# Patient Record
Sex: Male | Born: 1990 | Race: Black or African American | Hispanic: No | Marital: Married | Smoking: Never smoker
Health system: Southern US, Community
[De-identification: ages and names within clinical notes are randomized; demographics above are authoritative.]

## PROBLEM LIST (undated history)

## (undated) HISTORY — PX: OTHER SURGICAL HISTORY: SHX169

## (undated) HISTORY — PX: CHOLECYSTECTOMY: SHX55

---
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
## 2013-06-06 ENCOUNTER — Emergency Department (HOSPITAL_COMMUNITY)
Admission: EM | Admit: 2013-06-06 | Discharge: 2013-06-06 | Disposition: A | Discharge status: Home / Self Care | Payer: Self-pay | Attending: Emergency Medicine | Admitting: Emergency Medicine

## 2013-06-06 ENCOUNTER — Encounter (HOSPITAL_COMMUNITY): Payer: Self-pay | Admitting: Emergency Medicine

## 2013-06-06 DIAGNOSIS — H109 Unspecified conjunctivitis: Secondary | ICD-10-CM | POA: Insufficient documentation

## 2013-06-06 DIAGNOSIS — N342 Other urethritis: Secondary | ICD-10-CM | POA: Insufficient documentation

## 2013-06-06 DIAGNOSIS — R300 Dysuria: Secondary | ICD-10-CM | POA: Insufficient documentation

## 2013-06-06 LAB — URINALYSIS, ROUTINE W REFLEX MICROSCOPIC
Bilirubin Urine: NEGATIVE
Glucose, UA: NEGATIVE mg/dL
Ketones, ur: NEGATIVE mg/dL
Nitrite: NEGATIVE
Protein, ur: NEGATIVE mg/dL
Specific Gravity, Urine: 1.014 (ref 1.005–1.030)
Urobilinogen, UA: 0.2 mg/dL (ref 0.0–1.0)
pH: 5.5 (ref 5.0–8.0)

## 2013-06-06 LAB — URINE MICROSCOPIC-ADD ON

## 2013-06-06 LAB — GC/CHLAMYDIA PROBE AMP
CT Probe RNA: NEGATIVE
GC Probe RNA: NEGATIVE

## 2013-06-06 LAB — RPR: RPR Ser Ql: NONREACTIVE

## 2013-06-06 LAB — HIV ANTIBODY (ROUTINE TESTING W REFLEX): HIV: NONREACTIVE

## 2013-06-06 MED ORDER — DOXYCYCLINE HYCLATE 100 MG PO CAPS: 100.0000 mg | ORAL_CAPSULE | Freq: Two times a day (BID) | ORAL | Status: DC

## 2013-06-06 MED ORDER — LIDOCAINE HCL (PF) 1 % IJ SOLN
INTRAMUSCULAR | Status: AC
  Administered 2013-06-06: 2.1 mL
  Filled 2013-06-06: qty 5

## 2013-06-06 MED ORDER — SULFACETAMIDE SODIUM 10 % OP SOLN: 2.0000 [drp] | OPHTHALMIC | Status: DC

## 2013-06-06 MED ORDER — DOXYCYCLINE HYCLATE 100 MG PO TABS
100.0000 mg | ORAL_TABLET | Freq: Once | ORAL | Status: AC
  Administered 2013-06-06: 100 mg via ORAL
  Filled 2013-06-06: qty 1

## 2013-06-06 MED ORDER — CEFTRIAXONE SODIUM 250 MG IJ SOLR
250.0000 mg | Freq: Once | INTRAMUSCULAR | Status: AC
Start: 2013-06-06 — End: 2013-06-06
  Administered 2013-06-06: 250 mg via INTRAMUSCULAR
  Filled 2013-06-06: qty 250

## 2013-06-06 MED ORDER — AZITHROMYCIN 1 G PO PACK
1.0000 g | PACK | Freq: Once | ORAL | Status: AC
  Administered 2013-06-06: 1 g via ORAL
  Filled 2013-06-06: qty 1

## 2013-06-06 NOTE — ED Notes (Signed)
PT. REPORTS RIGHT EYE IRRITATION WITH REDDNESS  , PAIN / ITCH AND DRAINAGE ONSET YESTERDAY , DENIES INJURY , NO BLURRED VISION , ALSO REPORTS DYSURIA FOR 2 DAYS WITH NO HEMATURIA , NO FEVER OR CHILLS.

## 2013-06-06 NOTE — ED Provider Notes (Signed)
History    CSN: [id number] Arrival date & time 06/06/13  0046  First MD Initiated Contact with Patient 06/06/13 [phone]     Chief Complaint  Patient presents with  . Eye Drainage  . Dysuria   (Consider location/radiation/quality/duration/timing/severity/associated sxs/prior Treatment) The history is provided by the patient.   22 year old male noted onset of dysuria starting about 3 days ago. There has been any urethral discharge with a small amount of blood present as well. He denies urinary urgency, frequency, tenesmus. He denies abdominal pain or flank pain. He admits to having had unprotected sex about 2 weeks ago. Yesterday, he developed redness of his right eye with some watery drainage. He denies any pain or itching. Denies any visual change. He denies fever, chills, sweats. No past medical history on file. No past surgical history on file. No family history on file. History  Substance Use Topics  . Smoking status: Not on file  . Smokeless tobacco: Not on file  . Alcohol Use: Not on file    Review of Systems  All other systems reviewed and are negative.    Allergies  Review of patient's allergies indicates not on file.  Home Medications  No current outpatient prescriptions on file. BP 129/82  Temp(Src) 98.1 F (36.7 C) (Oral)  Resp 18  SpO2 100% Physical Exam  Nursing note and vitals reviewed.  22 year old male, resting comfortably and in no acute distress. Vital signs are normal. Oxygen saturation is 100%, which is normal. Head is normocephalic and atraumatic. PERRLA, EOMI. Oropharynx is clear. There is moderate erythema of the conjunctiva of the right eye. Cornea and anterior chamber are clear. There is no pain with light shining in either eye. There is no palpable preauricular lymph node. Neck is nontender and supple with shotty bilateral posterior cervical adenopathy. Back is nontender and there is no CVA tenderness. Lungs are clear without rales, wheezes, or  rhonchi. Chest is nontender. Heart has regular rate and rhythm without murmur. Abdomen is soft, flat, nontender without masses or hepatosplenomegaly and peristalsis is normoactive. Genitalia: Circumcised penis, testes descended without masses, moderate bilateral inguinal adenopathy. Extremities have no cyanosis or edema, full range of motion is present. Skin is warm and dry without rash. Neurologic: Mental status is normal, cranial nerves are intact, there are no motor or sensory deficits.  ED Course  Procedures (including critical care time) Results for orders placed during the hospital encounter of 06/06/13  URINALYSIS, ROUTINE W REFLEX MICROSCOPIC      Result Value Range   Color, Urine YELLOW  YELLOW   APPearance CLOUDY (*) CLEAR   Specific Gravity, Urine 1.014  1.005 - 1.030   pH 5.5  5.0 - 8.0   Glucose, UA NEGATIVE  NEGATIVE mg/dL   Hgb urine dipstick TRACE (*) NEGATIVE   Bilirubin Urine NEGATIVE  NEGATIVE   Ketones, ur NEGATIVE  NEGATIVE mg/dL   Protein, ur NEGATIVE  NEGATIVE mg/dL   Urobilinogen, UA 0.2  0.0 - 1.0 mg/dL   Nitrite NEGATIVE  NEGATIVE   Leukocytes, UA TRACE (*) NEGATIVE  URINE MICROSCOPIC-ADD ON      Result Value Range   WBC, UA 21-50  <3 WBC/hpf   RBC / HPF 3-6  <3 RBC/hpf   Bacteria, UA RARE  RARE   Urine-Other MUCOUS PRESENT     1. Urethritis   2. Conjunctivitis     MDM  Urethritis which is most likely either gonorrhea or Chlamydia. I symptoms consistent with conjunctivitis. Need to  consider possibility of Behcet's syndrome and Reiters syndrome but I think that the eye and genital problems are unrelated conditions. However, he will be covered with a course of doxycycline in addition to empiric treatment for gonorrhea. He is given a prescription for sulfacetamide solution for his eye and referred to ophthalmology should symptoms not be improving.  Dione Booze, MD 06/06/13 [phone]

## 2014-04-30 ENCOUNTER — Inpatient Hospital Stay (HOSPITAL_COMMUNITY)
Admission: EM | Admit: 2014-04-30 | Discharge: 2014-05-03 | DRG: 419 | Disposition: A | Discharge status: Home / Self Care | Payer: Medicaid Other | Attending: Surgery | Admitting: Surgery

## 2014-04-30 ENCOUNTER — Encounter (HOSPITAL_COMMUNITY): Payer: Self-pay | Admitting: Emergency Medicine

## 2014-04-30 DIAGNOSIS — K8010 Calculus of gallbladder with chronic cholecystitis without obstruction: Secondary | ICD-10-CM | POA: Diagnosis present

## 2014-04-30 DIAGNOSIS — R161 Splenomegaly, not elsewhere classified: Secondary | ICD-10-CM | POA: Diagnosis present

## 2014-04-30 DIAGNOSIS — B2790 Infectious mononucleosis, unspecified without complication: Secondary | ICD-10-CM | POA: Diagnosis present

## 2014-04-30 DIAGNOSIS — K8012 Calculus of gallbladder with acute and chronic cholecystitis without obstruction: Secondary | ICD-10-CM | POA: Diagnosis present

## 2014-04-30 DIAGNOSIS — Z9049 Acquired absence of other specified parts of digestive tract: Secondary | ICD-10-CM

## 2014-04-30 DIAGNOSIS — K819 Cholecystitis, unspecified: Secondary | ICD-10-CM

## 2014-04-30 DIAGNOSIS — K8000 Calculus of gallbladder with acute cholecystitis without obstruction: Principal | ICD-10-CM | POA: Diagnosis present

## 2014-04-30 NOTE — ED Notes (Signed)
Pt c/o intermittent n/v and fever x 8 days.

## 2014-05-01 ENCOUNTER — Emergency Department (HOSPITAL_COMMUNITY): Payer: Medicaid Other

## 2014-05-01 ENCOUNTER — Encounter (HOSPITAL_COMMUNITY): Payer: Self-pay

## 2014-05-01 ENCOUNTER — Encounter (HOSPITAL_COMMUNITY): Payer: Medicaid Other | Admitting: Anesthesiology

## 2014-05-01 ENCOUNTER — Inpatient Hospital Stay (HOSPITAL_COMMUNITY): Payer: Medicaid Other

## 2014-05-01 ENCOUNTER — Inpatient Hospital Stay: Admit: 2014-05-01 | Payer: Self-pay | Admitting: Surgery

## 2014-05-01 ENCOUNTER — Encounter (HOSPITAL_COMMUNITY): Admission: EM | Disposition: A | Discharge status: Home / Self Care | Payer: Self-pay | Source: Home / Self Care

## 2014-05-01 ENCOUNTER — Inpatient Hospital Stay (HOSPITAL_COMMUNITY): Payer: Medicaid Other | Admitting: Anesthesiology

## 2014-05-01 DIAGNOSIS — R161 Splenomegaly, not elsewhere classified: Secondary | ICD-10-CM | POA: Diagnosis present

## 2014-05-01 DIAGNOSIS — B2790 Infectious mononucleosis, unspecified without complication: Secondary | ICD-10-CM | POA: Diagnosis present

## 2014-05-01 DIAGNOSIS — K8000 Calculus of gallbladder with acute cholecystitis without obstruction: Secondary | ICD-10-CM | POA: Diagnosis present

## 2014-05-01 DIAGNOSIS — K8012 Calculus of gallbladder with acute and chronic cholecystitis without obstruction: Secondary | ICD-10-CM | POA: Diagnosis present

## 2014-05-01 DIAGNOSIS — K8010 Calculus of gallbladder with chronic cholecystitis without obstruction: Secondary | ICD-10-CM | POA: Diagnosis present

## 2014-05-01 DIAGNOSIS — R112 Nausea with vomiting, unspecified: Secondary | ICD-10-CM | POA: Diagnosis present

## 2014-05-01 DIAGNOSIS — Z9049 Acquired absence of other specified parts of digestive tract: Secondary | ICD-10-CM

## 2014-05-01 HISTORY — PX: CHOLECYSTECTOMY: SHX55

## 2014-05-01 LAB — CBC WITH DIFFERENTIAL/PLATELET
BASOS PCT: 5 % — AB (ref 0–1)
Basophils Absolute: 0.4 10*3/uL — ABNORMAL HIGH (ref 0.0–0.1)
EOS ABS: 0 10*3/uL (ref 0.0–0.7)
Eosinophils Relative: 0 % (ref 0–5)
HEMATOCRIT: 41.5 % (ref 39.0–52.0)
HEMOGLOBIN: 14.6 g/dL (ref 13.0–17.0)
LYMPHS PCT: 67 % — AB (ref 12–46)
Lymphs Abs: 5.6 10*3/uL — ABNORMAL HIGH (ref 0.7–4.0)
MCH: 31.1 pg (ref 26.0–34.0)
MCHC: 35.2 g/dL (ref 30.0–36.0)
MCV: 88.3 fL (ref 78.0–100.0)
MONOS PCT: 14 % — AB (ref 3–12)
Monocytes Absolute: 1.2 10*3/uL — ABNORMAL HIGH (ref 0.1–1.0)
NEUTROS ABS: 1.2 10*3/uL — AB (ref 1.7–7.7)
NEUTROS PCT: 14 % — AB (ref 43–77)
Platelets: 123 10*3/uL — ABNORMAL LOW (ref 150–400)
RBC: 4.7 MIL/uL (ref 4.22–5.81)
RDW: 12.7 % (ref 11.5–15.5)
WBC: 8.4 10*3/uL (ref 4.0–10.5)

## 2014-05-01 LAB — COMPREHENSIVE METABOLIC PANEL
ALK PHOS: 179 U/L — AB (ref 39–117)
ALT: 182 U/L — AB (ref 0–53)
AST: 195 U/L — AB (ref 0–37)
Albumin: 3.6 g/dL (ref 3.5–5.2)
BUN: 10 mg/dL (ref 6–23)
CALCIUM: 9 mg/dL (ref 8.4–10.5)
CO2: 25 meq/L (ref 19–32)
Chloride: 97 mEq/L (ref 96–112)
Creatinine, Ser: 1.04 mg/dL (ref 0.50–1.35)
GFR calc Af Amer: >90 mL/min (ref >90)
GLUCOSE: 94 mg/dL (ref 70–99)
POTASSIUM: 3.7 meq/L (ref 3.7–5.3)
SODIUM: 136 meq/L — AB (ref 137–147)
TOTAL PROTEIN: 7.1 g/dL (ref 6.0–8.3)
Total Bilirubin: 3.5 mg/dL — ABNORMAL HIGH (ref 0.3–1.2)

## 2014-05-01 LAB — I-STAT CG4 LACTIC ACID, ED: LACTIC ACID, VENOUS: 0.64 mmol/L (ref 0.5–2.2)

## 2014-05-01 LAB — URINALYSIS, ROUTINE W REFLEX MICROSCOPIC
GLUCOSE, UA: NEGATIVE mg/dL
Hgb urine dipstick: NEGATIVE
KETONES UR: NEGATIVE mg/dL
Leukocytes, UA: NEGATIVE
Nitrite: NEGATIVE
PH: 6 (ref 5.0–8.0)
PROTEIN: NEGATIVE mg/dL
Specific Gravity, Urine: 1.021 (ref 1.005–1.030)
Urobilinogen, UA: 1 mg/dL (ref 0.0–1.0)

## 2014-05-01 LAB — LIPASE, BLOOD: Lipase: 39 U/L (ref 11–59)

## 2014-05-01 LAB — MONONUCLEOSIS SCREEN: Mono Screen: POSITIVE — AB

## 2014-05-01 LAB — HIV ANTIBODY (ROUTINE TESTING W REFLEX): HIV 1&2 Ab, 4th Generation: NONREACTIVE

## 2014-05-01 SURGERY — LAPAROSCOPIC CHOLECYSTECTOMY WITH INTRAOPERATIVE CHOLANGIOGRAM
Anesthesia: General | Site: Abdomen

## 2014-05-01 MED ORDER — ONDANSETRON HCL 4 MG/2ML IJ SOLN
4.0000 mg | Freq: Four times a day (QID) | INTRAMUSCULAR | Status: DC | PRN
  Administered 2014-05-02: 4 mg via INTRAVENOUS
  Filled 2014-05-01: qty 2

## 2014-05-01 MED ORDER — GLYCOPYRROLATE 0.2 MG/ML IJ SOLN
INTRAMUSCULAR | Status: AC
  Filled 2014-05-01: qty 2

## 2014-05-01 MED ORDER — BUPIVACAINE LIPOSOME 1.3 % IJ SUSP
20.0000 mL | Freq: Once | INTRAMUSCULAR | Status: AC
  Administered 2014-05-01: 20 mL
  Filled 2014-05-01: qty 20

## 2014-05-01 MED ORDER — SUFENTANIL CITRATE 50 MCG/ML IV SOLN
INTRAVENOUS | Status: AC
  Filled 2014-05-01: qty 1

## 2014-05-01 MED ORDER — HYDROMORPHONE HCL PF 1 MG/ML IJ SOLN: 0.2500 mg | INTRAMUSCULAR | Status: DC | PRN

## 2014-05-01 MED ORDER — GLYCOPYRROLATE 0.2 MG/ML IJ SOLN
INTRAMUSCULAR | Status: DC | PRN
  Administered 2014-05-01: .4 mg via INTRAVENOUS

## 2014-05-01 MED ORDER — CISATRACURIUM BESYLATE (PF) 10 MG/5ML IV SOLN
INTRAVENOUS | Status: DC | PRN
  Administered 2014-05-01: 4 mg via INTRAVENOUS
  Administered 2014-05-01: 2 mg via INTRAVENOUS

## 2014-05-01 MED ORDER — SODIUM CHLORIDE 0.9 % IV SOLN
3.0000 g | Freq: Once | INTRAVENOUS | Status: AC
  Administered 2014-05-01: 3 g via INTRAVENOUS
  Filled 2014-05-01 (×2): qty 3

## 2014-05-01 MED ORDER — KCL IN DEXTROSE-NACL 20-5-0.9 MEQ/L-%-% IV SOLN
INTRAVENOUS | Status: DC
  Filled 2014-05-01: qty 1000

## 2014-05-01 MED ORDER — IOHEXOL 300 MG/ML  SOLN
50.0000 mL | Freq: Once | INTRAMUSCULAR | Status: AC | PRN
  Administered 2014-05-01: 50 mL via ORAL

## 2014-05-01 MED ORDER — CISATRACURIUM BESYLATE 20 MG/10ML IV SOLN
INTRAVENOUS | Status: AC
  Filled 2014-05-01: qty 10

## 2014-05-01 MED ORDER — SODIUM CHLORIDE 0.9 % IR SOLN
Status: DC | PRN
  Administered 2014-05-01: 1000 mL

## 2014-05-01 MED ORDER — MORPHINE SULFATE 2 MG/ML IJ SOLN: 1.0000 mg | INTRAMUSCULAR | Status: DC | PRN

## 2014-05-01 MED ORDER — ONDANSETRON HCL 4 MG/2ML IJ SOLN
INTRAMUSCULAR | Status: AC
  Filled 2014-05-01: qty 2

## 2014-05-01 MED ORDER — SUFENTANIL CITRATE 50 MCG/ML IV SOLN
INTRAVENOUS | Status: DC | PRN
  Administered 2014-05-01 (×2): 10 ug via INTRAVENOUS
  Administered 2014-05-01: 20 ug via INTRAVENOUS
  Administered 2014-05-01: 10 ug via INTRAVENOUS

## 2014-05-01 MED ORDER — SUCCINYLCHOLINE CHLORIDE 20 MG/ML IJ SOLN
INTRAMUSCULAR | Status: DC | PRN
  Administered 2014-05-01: 100 mg via INTRAVENOUS

## 2014-05-01 MED ORDER — ONDANSETRON HCL 4 MG/2ML IJ SOLN
4.0000 mg | Freq: Once | INTRAMUSCULAR | Status: AC
  Administered 2014-05-01: 4 mg via INTRAVENOUS
  Filled 2014-05-01: qty 2

## 2014-05-01 MED ORDER — ACETAMINOPHEN 325 MG PO TABS
650.0000 mg | ORAL_TABLET | Freq: Four times a day (QID) | ORAL | Status: DC | PRN
  Administered 2014-05-01 – 2014-05-02 (×2): 650 mg via ORAL
  Filled 2014-05-01: qty 2

## 2014-05-01 MED ORDER — SODIUM CHLORIDE 0.9 % IJ SOLN
INTRAMUSCULAR | Status: AC
  Filled 2014-05-01: qty 10

## 2014-05-01 MED ORDER — DEXAMETHASONE SODIUM PHOSPHATE 10 MG/ML IJ SOLN
INTRAMUSCULAR | Status: AC
  Filled 2014-05-01: qty 1

## 2014-05-01 MED ORDER — HYDROCODONE-ACETAMINOPHEN 5-325 MG PO TABS: 1.0000 | ORAL_TABLET | ORAL | Status: DC | PRN

## 2014-05-01 MED ORDER — DEXAMETHASONE SODIUM PHOSPHATE 10 MG/ML IJ SOLN
INTRAMUSCULAR | Status: DC | PRN
  Administered 2014-05-01: 10 mg via INTRAVENOUS

## 2014-05-01 MED ORDER — IOHEXOL 300 MG/ML  SOLN
100.0000 mL | Freq: Once | INTRAMUSCULAR | Status: AC | PRN
  Administered 2014-05-01: 100 mL via INTRAVENOUS

## 2014-05-01 MED ORDER — PROMETHAZINE HCL 25 MG/ML IJ SOLN: 6.2500 mg | INTRAMUSCULAR | Status: DC | PRN

## 2014-05-01 MED ORDER — KETOROLAC TROMETHAMINE 30 MG/ML IJ SOLN
15.0000 mg | Freq: Once | INTRAMUSCULAR | Status: AC | PRN
  Administered 2014-05-01: 30 mg via INTRAVENOUS

## 2014-05-01 MED ORDER — IOHEXOL 300 MG/ML  SOLN
INTRAMUSCULAR | Status: DC | PRN
  Administered 2014-05-01: 5 mL via INTRAVENOUS

## 2014-05-01 MED ORDER — MIDAZOLAM HCL 5 MG/5ML IJ SOLN
INTRAMUSCULAR | Status: DC | PRN
  Administered 2014-05-01: 2 mg via INTRAVENOUS

## 2014-05-01 MED ORDER — SODIUM CHLORIDE 0.9 % IV BOLUS (SEPSIS)
1000.0000 mL | Freq: Once | INTRAVENOUS | Status: AC
  Administered 2014-05-01: 1000 mL via INTRAVENOUS

## 2014-05-01 MED ORDER — KETOROLAC TROMETHAMINE 30 MG/ML IJ SOLN
INTRAMUSCULAR | Status: AC
  Filled 2014-05-01: qty 1

## 2014-05-01 MED ORDER — KCL IN DEXTROSE-NACL 20-5-0.45 MEQ/L-%-% IV SOLN
INTRAVENOUS | Status: DC
  Administered 2014-05-01 – 2014-05-02 (×3): via INTRAVENOUS
  Filled 2014-05-01 (×6): qty 1000

## 2014-05-01 MED ORDER — LIDOCAINE HCL (CARDIAC) 20 MG/ML IV SOLN
INTRAVENOUS | Status: AC
  Filled 2014-05-01: qty 5

## 2014-05-01 MED ORDER — MIDAZOLAM HCL 2 MG/2ML IJ SOLN
INTRAMUSCULAR | Status: AC
  Filled 2014-05-01: qty 2

## 2014-05-01 MED ORDER — PROPOFOL 10 MG/ML IV BOLUS
INTRAVENOUS | Status: AC
  Filled 2014-05-01: qty 20

## 2014-05-01 MED ORDER — HEPARIN SODIUM (PORCINE) 5000 UNIT/ML IJ SOLN
5000.0000 [IU] | Freq: Three times a day (TID) | INTRAMUSCULAR | Status: DC
  Administered 2014-05-01 – 2014-05-03 (×4): 5000 [IU] via SUBCUTANEOUS
  Filled 2014-05-01 (×8): qty 1

## 2014-05-01 MED ORDER — PANTOPRAZOLE SODIUM 40 MG IV SOLR
40.0000 mg | Freq: Every day | INTRAVENOUS | Status: DC
  Administered 2014-05-01 – 2014-05-02 (×2): 40 mg via INTRAVENOUS
  Filled 2014-05-01 (×3): qty 40

## 2014-05-01 MED ORDER — PROPOFOL 10 MG/ML IV BOLUS
INTRAVENOUS | Status: DC | PRN
  Administered 2014-05-01: 150 mg via INTRAVENOUS

## 2014-05-01 MED ORDER — AMPICILLIN-SULBACTAM SODIUM 3 (2-1) G IJ SOLR
3.0000 g | Freq: Four times a day (QID) | INTRAMUSCULAR | Status: DC
  Administered 2014-05-01 – 2014-05-03 (×8): 3 g via INTRAVENOUS
  Filled 2014-05-01 (×10): qty 3

## 2014-05-01 MED ORDER — NEOSTIGMINE METHYLSULFATE 10 MG/10ML IV SOLN
INTRAVENOUS | Status: AC
  Filled 2014-05-01: qty 1

## 2014-05-01 MED ORDER — BUPIVACAINE-EPINEPHRINE 0.25% -1:200000 IJ SOLN
INTRAMUSCULAR | Status: AC
  Filled 2014-05-01: qty 1

## 2014-05-01 MED ORDER — ACETAMINOPHEN 325 MG PO TABS
650.0000 mg | ORAL_TABLET | ORAL | Status: DC | PRN
  Administered 2014-05-03: 650 mg via ORAL
  Filled 2014-05-01 (×2): qty 2

## 2014-05-01 MED ORDER — NEOSTIGMINE METHYLSULFATE 10 MG/10ML IV SOLN
INTRAVENOUS | Status: DC | PRN
  Administered 2014-05-01: 3 mg via INTRAVENOUS

## 2014-05-01 MED ORDER — ONDANSETRON HCL 4 MG/2ML IJ SOLN
INTRAMUSCULAR | Status: DC | PRN
  Administered 2014-05-01: 4 mg via INTRAVENOUS

## 2014-05-01 MED ORDER — LIDOCAINE HCL (CARDIAC) 20 MG/ML IV SOLN
INTRAVENOUS | Status: DC | PRN
  Administered 2014-05-01: 100 mg via INTRAVENOUS

## 2014-05-01 MED ORDER — LACTATED RINGERS IV SOLN
INTRAVENOUS | Status: DC | PRN
  Administered 2014-05-01 (×2): via INTRAVENOUS

## 2014-05-01 SURGICAL SUPPLY — 38 items
APPLIER CLIP ROT 10 11.4 M/L (STAPLE) ×3
BENZOIN TINCTURE PRP APPL 2/3 (GAUZE/BANDAGES/DRESSINGS) IMPLANT
CABLE HIGH FREQUENCY MONO STRZ (ELECTRODE) IMPLANT
CATH REDDICK CHOLANGI 4FR 50CM (CATHETERS) ×3 IMPLANT
CLIP APPLIE ROT 10 11.4 M/L (STAPLE) ×1 IMPLANT
CLOSURE WOUND 1/2 X4 (GAUZE/BANDAGES/DRESSINGS)
COVER MAYO STAND STRL (DRAPES) ×3 IMPLANT
DECANTER SPIKE VIAL GLASS SM (MISCELLANEOUS) ×3 IMPLANT
DERMABOND ADVANCED (GAUZE/BANDAGES/DRESSINGS) ×2
DERMABOND ADVANCED .7 DNX12 (GAUZE/BANDAGES/DRESSINGS) ×1 IMPLANT
DRAPE C-ARM 42X120 X-RAY (DRAPES) ×3 IMPLANT
DRAPE LAPAROSCOPIC ABDOMINAL (DRAPES) ×3 IMPLANT
ELECT REM PT RETURN 9FT ADLT (ELECTROSURGICAL) ×3
ELECTRODE REM PT RTRN 9FT ADLT (ELECTROSURGICAL) ×1 IMPLANT
GLOVE BIOGEL M 8.0 STRL (GLOVE) ×6 IMPLANT
GOWN SPEC L4 XLG W/TWL (GOWN DISPOSABLE) ×3 IMPLANT
GOWN STRL REUS W/TWL XL LVL3 (GOWN DISPOSABLE) ×9 IMPLANT
HEMOSTAT SURGICEL 4X8 (HEMOSTASIS) IMPLANT
IV CATH 14GX2 1/4 (CATHETERS) ×3 IMPLANT
KIT BASIN OR (CUSTOM PROCEDURE TRAY) ×3 IMPLANT
POUCH RETRIEVAL ECOSAC 10 (ENDOMECHANICALS) ×1 IMPLANT
POUCH RETRIEVAL ECOSAC 10MM (ENDOMECHANICALS) ×2
POUCH SPECIMEN RETRIEVAL 10MM (ENDOMECHANICALS) ×3 IMPLANT
SCISSORS LAP 5X45 EPIX DISP (ENDOMECHANICALS) ×3 IMPLANT
SCRUB PCMX 4 OZ (MISCELLANEOUS) ×3 IMPLANT
SET IRRIG TUBING LAPAROSCOPIC (IRRIGATION / IRRIGATOR) ×6 IMPLANT
SLEEVE XCEL OPT CAN 5 100 (ENDOMECHANICALS) ×6 IMPLANT
SOLUTION ANTI FOG 6CC (MISCELLANEOUS) ×3 IMPLANT
STRIP CLOSURE SKIN 1/2X4 (GAUZE/BANDAGES/DRESSINGS) IMPLANT
SUT VIC AB 4-0 SH 18 (SUTURE) ×3 IMPLANT
SYR 20CC LL (SYRINGE) ×3 IMPLANT
TOWEL OR 17X26 10 PK STRL BLUE (TOWEL DISPOSABLE) ×3 IMPLANT
TOWEL OR NON WOVEN STRL DISP B (DISPOSABLE) ×3 IMPLANT
TRAY LAP CHOLE (CUSTOM PROCEDURE TRAY) ×3 IMPLANT
TROCAR BLADELESS OPT 5 100 (ENDOMECHANICALS) ×3 IMPLANT
TROCAR XCEL BLUNT TIP 100MML (ENDOMECHANICALS) ×3 IMPLANT
TROCAR XCEL NON-BLD 11X100MML (ENDOMECHANICALS) ×3 IMPLANT
TUBING INSUFFLATION 10FT LAP (TUBING) ×3 IMPLANT

## 2014-05-01 NOTE — Anesthesia Preprocedure Evaluation (Signed)
Anesthesia Evaluation  Patient identified by MRN, date of birth, ID band Patient awake    Reviewed: Allergy & Precautions, H&P , NPO status , Patient's Chart, lab work & pertinent test results  Airway Mallampati: II  TM Distance: >3 FB Neck ROM: Full    Dental no notable dental hx.    Pulmonary neg pulmonary ROS,  breath sounds clear to auscultation  Pulmonary exam normal       Cardiovascular negative cardio ROS  Rhythm:Regular Rate:Normal     Neuro/Psych negative neurological ROS  negative psych ROS   GI/Hepatic negative GI ROS, Neg liver ROS,   Endo/Other  negative endocrine ROS  Renal/GU negative Renal ROS  negative genitourinary   Musculoskeletal negative musculoskeletal ROS (+)   Abdominal   Peds negative pediatric ROS (+)  Hematology negative hematology ROS (+)   Anesthesia Other Findings   Reproductive/Obstetrics negative OB ROS                            Anesthesia Physical Anesthesia Plan  ASA: II  Anesthesia Plan: General   Post-op Pain Management:    Induction: Intravenous  Airway Management Planned: Oral ETT  Additional Equipment:   Intra-op Plan:   Post-operative Plan: Extubation in OR  Informed Consent: I have reviewed the patients History and Physical, chart, labs and discussed the procedure including the risks, benefits and alternatives for the proposed anesthesia with the patient or authorized representative who has indicated his/her understanding and acceptance.   Dental advisory given  Plan Discussed with: CRNA and Surgeon  Anesthesia Plan Comments:         Anesthesia Quick Evaluation  

## 2014-05-01 NOTE — ED Notes (Signed)
Dr Yelverton at bedside.  

## 2014-05-01 NOTE — Op Note (Signed)
Everlean PattersonJames A Macht @date @  Procedure: Laparoscopic Cholecystectomy with intraoperative cholangiogram  Surgeon: Wenda LowMatt Martin, MD, FACS Asst:  none  Anes:  General  Drains: None  Findings: 1.7 cm gallstones; no acute cholecystitis  Description of Procedure: The patient was taken to OR 1 and given general anesthesia.  The patient was prepped with PCMX and draped sterilely. A time out was performed.  Access to the abdomen was achieved with a Hassan technique through the umbilicus.  Port placement included two 5 mm trocars and 2 11 mm trocars.    The gallbladder was visualized and the fundus was grasped and the gallbladder was elevated. Traction on the infundibulum allowed for successful demonstration of the critical view. Inflammatory changes were slight.  The cystic duct was identified and clipped up on the gallbladder and an incision was made in the cystic duct and the Reddick catheter was inserted after milking the cystic duct of any debris. A dynamic cholangiogram was performed which demonstrated small ducts and prompt flow into the duodenum and good intrahepatic filling.    The cystic duct was then triple clipped and divided, the cystic artery was double clipped and divided and then the gallbladder was removed from the gallbladder bed. Removal of the gallbladder from the gallbladder bed was straightforward.  The gallbladder was then placed in a bag and brought out through one of the 10 mm trocar sites. The gallbladder bed was inspected and no bleeding or bile leaks were seen.   Laparoscopic visualization was used when closing fascial defects for trocar sites.   Incisions were injected with Exparel and closed with 4-0 Vicryl and Dermabond on the skin.  Sponge and needle count were correct.    The patient was taken to the recovery room in satisfactory condition.

## 2014-05-01 NOTE — ED Notes (Signed)
Attempted twice on blood draw. Blood hemolyzed...notified nurse and EMT Ander SladeJoy will redraw

## 2014-05-01 NOTE — ED Notes (Signed)
Bed: WA24 Expected date:  Expected time:  Means of arrival:  Comments: Room 2 

## 2014-05-01 NOTE — H&P (Signed)
Cameron Phillips is an 23 y.o. male.    Chief Complaint: nausea and vomiting  HPI: Patient is a generally healthy 23 year old male who just over one week ago after eating developed nausea and vomiting. Since then he has not felt well. He has had intermittent nausea and vomiting about every other day. Some days he'll feel better than others. Overall he has not been getting any better and actually getting gradually worse. He has generalized malaise. For several days he has had some subjective fever and chills although has not taken his temperature. He denies abdominal pain. He has a tendency toward constipation generally but has had no change in his bowel movements specifically diarrhea or blood. He has noticed some dark urine which he felt was dehydration which is one reason he came to the emergency room with his family. He and his mother deny any previous history of GI illness any significant medical problems of any kind. The nausea and vomiting can be after he tries to eat or without eating. He does not drink alcohol.  History reviewed. No pertinent past medical history.  Past Surgical History  Procedure Laterality Date  . Undescended testicle      No family history on file. Social History:  reports that he has never smoked. He does not have any smokeless tobacco history on file. He reports that he does not drink alcohol or use illicit drugs.  Allergies: No Known Allergies   Current Facility-Administered Medications  Medication Dose Route Frequency Provider Last Rate Last Dose  . acetaminophen (TYLENOL) tablet 650 mg  650 mg Oral Q6H PRN Julianne Rice, MD   650 mg at 05/01/14 0053   Current Outpatient Prescriptions  Medication Sig Dispense Refill  . ibuprofen (ADVIL,MOTRIN) 200 MG tablet Take 200 mg by mouth every 6 (six) hours as needed (for pain.).      Marland Kitchen Multiple Vitamin (MULTIVITAMIN WITH MINERALS) TABS tablet Take 1 tablet by mouth daily.      Marland Kitchen triamcinolone cream (KENALOG) 0.1 %  Apply 1 application topically 2 (two) times daily.         Results for orders placed during the hospital encounter of 04/30/14 (from the past 48 hour(s))  CBC WITH DIFFERENTIAL     Status: Abnormal   Collection Time    05/01/14 12:24 AM      Result Value Ref Range   WBC 8.4  4.0 - 10.5 K/uL   RBC 4.70  4.22 - 5.81 MIL/uL   Hemoglobin 14.6  13.0 - 17.0 g/dL   HCT 41.5  39.0 - 52.0 %   MCV 88.3  78.0 - 100.0 fL   MCH 31.1  26.0 - 34.0 pg   MCHC 35.2  30.0 - 36.0 g/dL   RDW 12.7  11.5 - 15.5 %   Platelets 123 (*) 150 - 400 K/uL   Neutrophils Relative % 14 (*) 43 - 77 %   Lymphocytes Relative 67 (*) 12 - 46 %   Monocytes Relative 14 (*) 3 - 12 %   Eosinophils Relative 0  0 - 5 %   Basophils Relative 5 (*) 0 - 1 %   Neutro Abs 1.2 (*) 1.7 - 7.7 K/uL   Lymphs Abs 5.6 (*) 0.7 - 4.0 K/uL   Monocytes Absolute 1.2 (*) 0.1 - 1.0 K/uL   Eosinophils Absolute 0.0  0.0 - 0.7 K/uL   Basophils Absolute 0.4 (*) 0.0 - 0.1 K/uL   WBC Morphology ATYPICAL LYMPHOCYTES     Smear Review  LARGE PLATELETS PRESENT    COMPREHENSIVE METABOLIC PANEL     Status: Abnormal   Collection Time    05/01/14 12:24 AM      Result Value Ref Range   Sodium 136 (*) 137 - 147 mEq/L   Potassium 3.7  3.7 - 5.3 mEq/L   Chloride 97  96 - 112 mEq/L   CO2 25  19 - 32 mEq/L   Glucose, Bld 94  70 - 99 mg/dL   BUN 10  6 - 23 mg/dL   Creatinine, Ser 1.04  0.50 - 1.35 mg/dL   Calcium 9.0  8.4 - 10.5 mg/dL   Total Protein 7.1  6.0 - 8.3 g/dL   Albumin 3.6  3.5 - 5.2 g/dL   AST 195 (*) 0 - 37 U/L   ALT 182 (*) 0 - 53 U/L   Alkaline Phosphatase 179 (*) 39 - 117 U/L   Total Bilirubin 3.5 (*) 0.3 - 1.2 mg/dL   GFR calc non Af Amer >90  >90 mL/min   GFR calc Af Amer >90  >90 mL/min   Comment: (NOTE)     The eGFR has been calculated using the CKD EPI equation.     This calculation has not been validated in all clinical situations.     eGFR's persistently <90 mL/min signify possible Chronic Kidney     Disease.  I-STAT CG4  LACTIC ACID, ED     Status: None   Collection Time    05/01/14  2:26 AM      Result Value Ref Range   Lactic Acid, Venous 0.64  0.5 - 2.2 mmol/L  URINALYSIS, ROUTINE W REFLEX MICROSCOPIC     Status: Abnormal   Collection Time    05/01/14  3:29 AM      Result Value Ref Range   Color, Urine AMBER (*) YELLOW   Comment: BIOCHEMICALS MAY BE AFFECTED BY COLOR   APPearance CLEAR  CLEAR   Specific Gravity, Urine 1.021  1.005 - 1.030   pH 6.0  5.0 - 8.0   Glucose, UA NEGATIVE  NEGATIVE mg/dL   Hgb urine dipstick NEGATIVE  NEGATIVE   Bilirubin Urine MODERATE (*) NEGATIVE   Ketones, ur NEGATIVE  NEGATIVE mg/dL   Protein, ur NEGATIVE  NEGATIVE mg/dL   Urobilinogen, UA 1.0  0.0 - 1.0 mg/dL   Nitrite NEGATIVE  NEGATIVE   Leukocytes, UA NEGATIVE  NEGATIVE   Comment: MICROSCOPIC NOT DONE ON URINES WITH NEGATIVE PROTEIN, BLOOD, LEUKOCYTES, NITRITE, OR GLUCOSE <1000 mg/dL.  LIPASE, BLOOD     Status: None   Collection Time    05/01/14  4:50 AM      Result Value Ref Range   Lipase 39  11 - 59 U/L   US Abdomen Complete  05/01/2014   CLINICAL DATA:  Abdominal pain. Gallbladder wall thickening noted on CT.  EXAM: ULTRASOUND ABDOMEN COMPLETE  COMPARISON:  CT of the abdomen and pelvis performed earlier today at 3:02 a.m.  FINDINGS: Gallbladder:  A large 1.7 cm stone is noted within the gallbladder. The gallbladder is partially contracted. There is significant gallbladder wall thickening, measuring up to 0.7 cm. No pericholecystic fluid is seen. Evaluation for ultrasonographic Murphy's sign is equivocal, as the patient is on pain medication.  Common bile duct:  Diameter: 0.3 cm, within normal limits in caliber.  Liver:  No focal lesion identified. Within normal limits in parenchymal echogenicity.  IVC:  No abnormality visualized.  Pancreas:  Visualized portion unremarkable.  Spleen:  Borderline  normal in length, though bulky, with increased volume.  Right Kidney:  Length: 10.8 cm. Echogenicity within normal  limits. No mass or hydronephrosis visualized.  Left Kidney:  Length: 10.6 cm. Echogenicity within normal limits. No mass or hydronephrosis visualized.  Abdominal aorta:  No aneurysm visualized. The distal aspect of the abdominal aorta is not characterized.  Other findings:  None.  IMPRESSION: 1. Diffuse gallbladder wall thickening again noted. Evaluation for ultrasonographic Murphy's sign is equivocal, as the patient is on pain medication. Findings raise question for mild acute cholecystitis. Associated cholelithiasis noted. No evidence for obstruction. 2. Splenomegaly noted.   Electronically Signed   By: Garald Balding M.D.   On: 05/01/2014 04:40   Ct Abdomen Pelvis W Contrast  05/01/2014   CLINICAL DATA:  Fever, nausea and vomiting.  Dark urine.  EXAM: CT ABDOMEN AND PELVIS WITH CONTRAST  TECHNIQUE: Multidetector CT imaging of the abdomen and pelvis was performed using the standard protocol following bolus administration of intravenous contrast.  CONTRAST:  155m OMNIPAQUE IOHEXOL 300 MG/ML  SOLN  COMPARISON:  Scrotal ultrasound performed 06/25/2008  FINDINGS: The visualized lung bases are clear.  The liver is unremarkable in appearance. The spleen is mildly enlarged, measuring 13.4 cm in length. A small stone is noted within the gallbladder; there is mild apparent gallbladder wall thickening, of uncertain significance. The pancreas and adrenal glands are unremarkable.  The kidneys are unremarkable in appearance. There is no evidence of hydronephrosis. No renal or ureteral stones are seen. No perinephric stranding is appreciated.  No free fluid is identified. The small bowel is unremarkable in appearance. The stomach is within normal limits. No acute vascular abnormalities are seen. Mildly prominent pericecal nodes are seen, of uncertain significance.  The appendix is normal in caliber and contains air, without evidence for appendicitis. The colon is grossly unremarkable in appearance.  The bladder is mildly  distended and grossly unremarkable. The prostate remains normal in size. No inguinal lymphadenopathy is seen.  No acute osseous abnormalities are identified.  IMPRESSION: 1. Cholelithiasis noted. Mild apparent gallbladder wall thickening, of uncertain significance. This could simply reflect partial decompression, though would correlate to exclude mild cholecystitis. 2. Mild splenomegaly noted. 3. Mildly prominent pericecal nodes, of uncertain significance.   Electronically Signed   By: JGarald BaldingM.D.   On: 05/01/2014 03:10   Dg Chest Port 1 View  05/01/2014   CLINICAL DATA:  Fever, nausea and vomiting.  EXAM: PORTABLE CHEST - 1 VIEW  COMPARISON:  None.  FINDINGS: The lungs are well-aerated and clear. There is no evidence of focal opacification, pleural effusion or pneumothorax.  The cardiomediastinal silhouette is within normal limits. No acute osseous abnormalities are seen.  IMPRESSION: No acute cardiopulmonary process seen.   Electronically Signed   By: JGarald BaldingM.D.   On: 05/01/2014 01:06    Review of Systems  Constitutional: Positive for fever, chills and malaise/fatigue.  HENT: Negative.   Respiratory: Negative.   Cardiovascular: Negative.   Gastrointestinal: Positive for nausea, vomiting and constipation. Negative for abdominal pain and diarrhea.  Genitourinary: Negative.   Musculoskeletal: Negative.     Blood pressure 112/57, pulse 93, temperature 98.1 F (36.7 C), temperature source Oral, resp. rate 20, height _0  (1.702 m), weight 144 lb (65.318 kg), SpO2 100.00%. Physical Exam  General: Alert, well-developed young [place] male, in no distress Skin: Warm and dry without rash or infection. HEENT: No palpable masses or thyromegaly. Sclera nonicteric. Pupils equal round and reactive. Oropharynx clear. Lymph  nodes: No cervical, supraclavicular, or inguinal nodes palpable. Lungs: Breath sounds clear and equal without increased work of breathing Cardiovascular:  Regular rate and rhythm without murmur. No JVD or edema. Peripheral pulses intact. Abdomen: Nondistended. Soft and nontender. No masses palpable. No organomegaly. No palpable hernias. Extremities: No edema or joint swelling or deformity. No chronic venous stasis changes. Neurologic: Alert and fully oriented.  Assessment/Plan 1 week illness consisting mainly of persistent nausea and occasional vomiting and fever. Workup was significant for cholelithiasis with normal common bile duct but moderately elevated LFTs and significant gallbladder wall thickening on ultrasound. This appears consistent with cholecystitis. Cannot rule out common bile duct stone. Presentation is unusual lack of abdominal pain or tenderness but no other apparent source of his illness. He has mild splenomegaly of uncertain etiology. Not typical presentation for mononucleosis either. I suspect he has cholecystitis and would benefit from cholecystectomy with operative cholangiogram. Patient will be admitted. He has been started on IV antibiotics. Discussed with the patient and his mother.  Azayla Polo T 05/01/2014, 6:27 AM

## 2014-05-01 NOTE — Anesthesia Postprocedure Evaluation (Signed)
  Anesthesia Post-op Note  Patient: Cameron PattersonJames A Spackman  Procedure(s) Performed: Procedure(s) (LRB): LAPAROSCOPIC CHOLECYSTECTOMY WITH INTRAOPERATIVE CHOLANGIOGRAM (N/A)  Patient Location: PACU  Anesthesia Type: General  Level of Consciousness: awake and alert   Airway and Oxygen Therapy: Patient Spontanous Breathing  Post-op Pain: mild  Post-op Assessment: Post-op Vital signs reviewed, Patient's Cardiovascular Status Stable, Respiratory Function Stable, Patent Airway and No signs of Nausea or vomiting  Last Vitals:  Filed Vitals:   05/01/14 1010  BP: 136/78  Pulse: 110  Temp: 36.9 C  Resp: 23    Post-op Vital Signs: stable   Complications: No apparent anesthesia complications

## 2014-05-01 NOTE — ED Provider Notes (Signed)
CSN: [id number]     Arrival date & time 04/30/14  2335 History   First MD Initiated Contact with Patient 05/01/14 0106     Chief Complaint  Patient presents with  . Fever  . Emesis     (Consider location/radiation/quality/duration/timing/severity/associated sxs/prior Treatment) HPI Patient has had 8 days of intermittent fevers to 101 and vomiting. He last vomited yesterday. He also complains of difficulty having a bowel movement. He has had a nonproductive cough he complains of mild chest tightness. He denies any headache, neck stiffness or rash. No recent tick or insect bites. Patient denies any abdominal pain. He's had no abdominal surgeries. No sick contacts. History reviewed. No pertinent past medical history. Past Surgical History  Procedure Laterality Date  . Undescended testicle     No family history on file. History  Substance Use Topics  . Smoking status: Never Smoker   . Smokeless tobacco: Not on file  . Alcohol Use: No    Review of Systems  Constitutional: Positive for fever, chills and fatigue.  HENT: Negative for congestion, sinus pressure and sore throat.   Respiratory: Positive for cough. Negative for chest tightness, shortness of breath and wheezing.   Cardiovascular: Negative for chest pain, palpitations and leg swelling.  Gastrointestinal: Positive for nausea, vomiting and constipation. Negative for abdominal pain and blood in stool.  Genitourinary: Negative for dysuria, frequency and flank pain.  Musculoskeletal: Negative for back pain, myalgias, neck pain and neck stiffness.  Skin: Negative for rash and wound.  Neurological: Negative for dizziness, syncope, weakness, light-headedness, numbness and headaches.  All other systems reviewed and are negative.     Allergies  Review of patient's allergies indicates no known allergies.  Home Medications   Prior to Admission medications   Medication Sig Start Date End Date Taking? Authorizing Provider   ibuprofen (ADVIL,MOTRIN) 200 MG tablet Take 200 mg by mouth every 6 (six) hours as needed (for pain.).   Yes Historical Provider, MD  Multiple Vitamin (MULTIVITAMIN WITH MINERALS) TABS tablet Take 1 tablet by mouth daily.   Yes Historical Provider, MD  triamcinolone cream (KENALOG) 0.1 % Apply 1 application topically 2 (two) times daily.   Yes Historical Provider, MD   BP 127/73  Pulse 94  Temp(Src) 98.7 F (37.1 C) (Oral)  Resp 18  Ht 5\' 7"  (1.702 m)  Wt 144 lb (65.318 kg)  BMI 22.55 kg/m2  SpO2 100% Physical Exam  Nursing note and vitals reviewed. Constitutional: He is oriented to person, place, and time. He appears well-developed and well-nourished. No distress.  HENT:  Head: Normocephalic and atraumatic.  Mouth/Throat: Oropharynx is clear and moist. No oropharyngeal exudate.  Eyes: EOM are normal. Pupils are equal, round, and reactive to light.  Neck: Normal range of motion. Neck supple.  No meningismus  Cardiovascular: Normal rate and regular rhythm.  Exam reveals no gallop and no friction rub.   No murmur heard. Pulmonary/Chest: Effort normal and breath sounds normal. No respiratory distress. He has no wheezes. He has no rales. He exhibits no tenderness.  Abdominal: Soft. Bowel sounds are normal. He exhibits distension (mild distention). He exhibits no mass. There is no tenderness. There is no rebound and no guarding.  Musculoskeletal: Normal range of motion. He exhibits no edema and no tenderness.  No CVA tenderness bilaterally  Neurological: He is alert and oriented to person, place, and time.  Moves all extremities without deficit. Sensation is grossly intact.  Skin: Skin is warm and dry. No rash noted. No erythema.  Psychiatric: He has a normal mood and affect. His behavior is normal.    ED Course  Procedures (including critical care time) Labs Review Labs Reviewed  CBC WITH DIFFERENTIAL - Abnormal; Notable for the following:    Platelets 123 (*)    Neutrophils  Relative % 14 (*)    Lymphocytes Relative 67 (*)    Monocytes Relative 14 (*)    Basophils Relative 5 (*)    Neutro Abs 1.2 (*)    Lymphs Abs 5.6 (*)    Monocytes Absolute 1.2 (*)    Basophils Absolute 0.4 (*)    All other components within normal limits  COMPREHENSIVE METABOLIC PANEL - Abnormal; Notable for the following:    Sodium 136 (*)    AST 195 (*)    ALT 182 (*)    Alkaline Phosphatase 179 (*)    Total Bilirubin 3.5 (*)    All other components within normal limits  URINALYSIS, ROUTINE W REFLEX MICROSCOPIC - Abnormal; Notable for the following:    Color, Urine AMBER (*)    Bilirubin Urine MODERATE (*)    All other components within normal limits  CULTURE, BLOOD (ROUTINE X 2)  CULTURE, BLOOD (ROUTINE X 2)  URINE CULTURE  LIPASE, BLOOD  I-STAT CG4 LACTIC ACID, ED    Imaging Review [place] Abdomen Complete  05/01/2014   CLINICAL DATA:  Abdominal pain. Gallbladder wall thickening noted on CT.  EXAM: ULTRASOUND ABDOMEN COMPLETE  COMPARISON:  CT of the abdomen and pelvis performed earlier today at 3:02 a.m.  FINDINGS: Gallbladder:  A large 1.7 cm stone is noted within the gallbladder. The gallbladder is partially contracted. There is significant gallbladder wall thickening, measuring up to 0.7 cm. No pericholecystic fluid is seen. Evaluation for ultrasonographic Murphy's sign is equivocal, as the patient is on pain medication.  Common bile duct:  Diameter: 0.3 cm, within normal limits in caliber.  Liver:  No focal lesion identified. Within normal limits in parenchymal echogenicity.  IVC:  No abnormality visualized.  Pancreas:  Visualized portion unremarkable.  Spleen:  Borderline normal in length, though bulky, with increased volume.  Right Kidney:  Length: 10.8 cm. Echogenicity within normal limits. No mass or hydronephrosis visualized.  Left Kidney:  Length: 10.6 cm. Echogenicity within normal limits. No mass or hydronephrosis visualized.  Abdominal aorta:  No aneurysm visualized. The  distal aspect of the abdominal aorta is not characterized.  Other findings:  None.  IMPRESSION: 1. Diffuse gallbladder wall thickening again noted. Evaluation for ultrasonographic Murphy's sign is equivocal, as the patient is on pain medication. Findings raise question for mild acute cholecystitis. Associated cholelithiasis noted. No evidence for obstruction. 2. Splenomegaly noted.   Electronically Signed   By: Roanna RaiderJeffery  Chang M.D.   On: 05/01/2014 04:40   Ct Abdomen Pelvis W Contrast  05/01/2014   CLINICAL DATA:  Fever, nausea and vomiting.  Dark urine.  EXAM: CT ABDOMEN AND PELVIS WITH CONTRAST  TECHNIQUE: Multidetector CT imaging of the abdomen and pelvis was performed using the standard protocol following bolus administration of intravenous contrast.  CONTRAST:  100mL OMNIPAQUE IOHEXOL 300 MG/ML  SOLN  COMPARISON:  Scrotal ultrasound performed 06/25/2008  FINDINGS: The visualized lung bases are clear.  The liver is unremarkable in appearance. The spleen is mildly enlarged, measuring 13.4 cm in length. A small stone is noted within the gallbladder; there is mild apparent gallbladder wall thickening, of uncertain significance. The pancreas and adrenal glands are unremarkable.  The kidneys are unremarkable in appearance. There is  no evidence of hydronephrosis. No renal or ureteral stones are seen. No perinephric stranding is appreciated.  No free fluid is identified. The small bowel is unremarkable in appearance. The stomach is within normal limits. No acute vascular abnormalities are seen. Mildly prominent pericecal nodes are seen, of uncertain significance.  The appendix is normal in caliber and contains air, without evidence for appendicitis. The colon is grossly unremarkable in appearance.  The bladder is mildly distended and grossly unremarkable. The prostate remains normal in size. No inguinal lymphadenopathy is seen.  No acute osseous abnormalities are identified.  IMPRESSION: 1. Cholelithiasis noted. Mild  apparent gallbladder wall thickening, of uncertain significance. This could simply reflect partial decompression, though would correlate to exclude mild cholecystitis. 2. Mild splenomegaly noted. 3. Mildly prominent pericecal nodes, of uncertain significance.   Electronically Signed   By: Roanna RaiderJeffery  Chang M.D.   On: 05/01/2014 03:10   Dg Chest Port 1 View  05/01/2014   CLINICAL DATA:  Fever, nausea and vomiting.  EXAM: PORTABLE CHEST - 1 VIEW  COMPARISON:  None.  FINDINGS: The lungs are well-aerated and clear. There is no evidence of focal opacification, pleural effusion or pneumothorax.  The cardiomediastinal silhouette is within normal limits. No acute osseous abnormalities are seen.  IMPRESSION: No acute cardiopulmonary process seen.   Electronically Signed   By: Roanna RaiderJeffery  Chang M.D.   On: 05/01/2014 01:06     EKG Interpretation None      MDM   Final diagnoses:  None   Discussed with Dr. Johna SheriffHoxworth. Advises giving Unasyn and will see in the emergency department. A long discussion with the patient and his mother. They're aware of the findings. Patient's pain remains controlled in the emergency department. Vital signs stable.     Loren Raceravid Yelverton, MD 05/01/14 [id number]

## 2014-05-01 NOTE — Anesthesia Procedure Notes (Signed)
Procedure Name: Intubation Date/Time: 05/01/2014 8:29 AM Performed by: Leroy LibmanEARDON, DIANA L Patient Re-evaluated:Patient Re-evaluated prior to inductionOxygen Delivery Method: Circle system utilized Preoxygenation: Pre-oxygenation with 100% oxygen Intubation Type: IV induction, Rapid sequence and Cricoid Pressure applied Laryngoscope Size: Miller and 3 Grade View: Grade I Tube type: Oral Number of attempts: 1 Airway Equipment and Method: Stylet Placement Confirmation: ETT inserted through vocal cords under direct vision,  breath sounds checked- equal and bilateral and positive ETCO2 Secured at: 21 cm Tube secured with: Tape Dental Injury: Injury to lip

## 2014-05-01 NOTE — ED Notes (Signed)
Bedside US testing in process Patient appears in NAD

## 2014-05-01 NOTE — ED Notes (Signed)
EDP at bedside  

## 2014-05-01 NOTE — Transfer of Care (Signed)
Immediate Anesthesia Transfer of Care Note  Patient: Cameron Phillips  Procedure(s) Performed: Procedure(s): LAPAROSCOPIC CHOLECYSTECTOMY WITH INTRAOPERATIVE CHOLANGIOGRAM (N/A)  Patient Location: PACU  Anesthesia Type:General  Level of Consciousness: sedated  Airway & Oxygen Therapy: Patient Spontanous Breathing and Patient connected to face mask oxygen  Post-op Assessment: Report given to PACU RN and Post -op Vital signs reviewed and stable  Post vital signs: Reviewed and stable  Complications: No apparent anesthesia complications

## 2014-05-01 NOTE — ED Notes (Signed)
I have spoken with him and found him in no distress. O.R. Permit signed; Madelin Rearandhe is taken to O.R. At this time without incident.

## 2014-05-02 ENCOUNTER — Encounter (HOSPITAL_COMMUNITY): Payer: Self-pay | Admitting: Surgery

## 2014-05-02 LAB — COMPREHENSIVE METABOLIC PANEL
ALT: 170 U/L — ABNORMAL HIGH (ref 0–53)
AST: 162 U/L — ABNORMAL HIGH (ref 0–37)
Albumin: 2.8 g/dL — ABNORMAL LOW (ref 3.5–5.2)
Alkaline Phosphatase: 162 U/L — ABNORMAL HIGH (ref 39–117)
BUN: 7 mg/dL (ref 6–23)
CALCIUM: 8.3 mg/dL — AB (ref 8.4–10.5)
CO2: 27 mEq/L (ref 19–32)
Chloride: 102 mEq/L (ref 96–112)
Creatinine, Ser: 1.01 mg/dL (ref 0.50–1.35)
GLUCOSE: 140 mg/dL — AB (ref 70–99)
Potassium: 4.2 mEq/L (ref 3.7–5.3)
Sodium: 139 mEq/L (ref 137–147)
Total Bilirubin: 3.1 mg/dL — ABNORMAL HIGH (ref 0.3–1.2)
Total Protein: 5.8 g/dL — ABNORMAL LOW (ref 6.0–8.3)

## 2014-05-02 LAB — CBC
HEMATOCRIT: 39.4 % (ref 39.0–52.0)
HEMOGLOBIN: 13.3 g/dL (ref 13.0–17.0)
MCH: 30.2 pg (ref 26.0–34.0)
MCHC: 33.8 g/dL (ref 30.0–36.0)
MCV: 89.5 fL (ref 78.0–100.0)
Platelets: 129 10*3/uL — ABNORMAL LOW (ref 150–400)
RBC: 4.4 MIL/uL (ref 4.22–5.81)
RDW: 13 % (ref 11.5–15.5)
WBC: 8.2 10*3/uL (ref 4.0–10.5)

## 2014-05-02 LAB — URINE CULTURE: Colony Count: 8000

## 2014-05-02 MED ORDER — ONDANSETRON HCL 4 MG PO TABS: 4.0000 mg | ORAL_TABLET | ORAL | Status: DC | PRN

## 2014-05-02 MED ORDER — HYDROCODONE-ACETAMINOPHEN 5-325 MG PO TABS: 1.0000 | ORAL_TABLET | ORAL | Status: DC | PRN

## 2014-05-02 NOTE — Care Management Note (Signed)
    Page 1 of 1   05/02/2014     12:17:44 PM CARE MANAGEMENT NOTE 05/02/2014  Patient:  Cameron Phillips, Cameron Phillips   Account Number:  [id number]  Date Initiated:  05/02/2014  Documentation initiated by:  [facility]  Subjective/Objective Assessment:   adm: nausea and vomiting; Laparoscopic Cholecystectomy with intraoperative cholangiogram     Action/Plan:   discharge planning   Anticipated DC Date:  05/02/2014   Anticipated DC Plan:  [place]  CM consult  [facility]  Medication Assistance  PCP issues      Choice offered to / List presented to:             Status of service:  Completed, signed off Medicare Important Message given?   (If response is "NO", the following Medicare IM given date fields will be blank) Date Medicare IM given:   Date Additional Medicare IM given:    Discharge Disposition:  HOME/SELF CARE  Per UR Regulation:    If discussed at Long Length of Stay Meetings, dates discussed:    Comments:  05/02/14 12:05 CM met with pt in room and gave him [facility] pamphlet.  Pt verbalized understanding he will call when he gets home to make an appt for an orange card, to secure Phillips PCP, and to begin the insurance process/Medicaid.  Pt also verbalized understanding he can get his pain medication from the $4 dollar list at [facility].  No other CM needs were communicated.  Mariane Masters, BSN, CM [phone].

## 2014-05-02 NOTE — Discharge Summary (Addendum)
Patient ID: Cameron Phillips MRN: [id number] DOB/AGE: 21/04/1991 21 y.o.  Admit date: 04/30/2014 Discharge date: 05/03/2014  Procedures: lap chole with IOC  Consults: None  Reason for Admission: Patient is a generally healthy 23 year old male who just over one week ago after eating developed nausea and vomiting. Since then he has not felt well. He has had intermittent nausea and vomiting about every other day. Some days he'll feel better than others. Overall he has not been getting any better and actually getting gradually worse. He has generalized malaise. For several days he has had some subjective fever and chills although has not taken his temperature. He denies abdominal pain. He has a tendency toward constipation generally but has had no change in his bowel movements specifically diarrhea or blood. He has noticed some dark urine which he felt was dehydration which is one reason he came to the emergency room with his family. He and his mother deny any previous history of GI illness any significant medical problems of any kind. The nausea and vomiting can be after he tries to eat or without eating. He does not drink alcohol.  Admission Diagnoses:  1. cholecystitis  Hospital Course: The patient was admitted for lap chole.  He was found to have some mild splenomegaly on CT scan along with some abnormalities of his labs.  HIV, Mono, and CMV were checked.  His mono returned positive.  He was still taken to the OR for a lap chole due to his ultrasound findings.  He tolerated this well.  On POD 1, he had another couple of episodes of emesis and low grade fever of 100.8.  He was kept until POD 2.  These symptoms were felt to be secondary to his mono and not his surgery.  He was feeling better on POD 2 and stable for dc home.  PE: Abd: soft, appropriately tender, +BS, ND, incisions c/d/i  Discharge Diagnoses:  Active Problems:   Cholelithiasis with acute and chronic cholecystitis   S/P laparoscopic  cholecystectomyJune2015 Mononucleosis  Discharge Medications:   Medication List         HYDROcodone-acetaminophen 5-325 MG per tablet  Commonly known as:  NORCO/VICODIN  Take 1-2 tablets by mouth every 4 (four) hours as needed for moderate pain.     ibuprofen 200 MG tablet  Commonly known as:  ADVIL,MOTRIN  Take 200 mg by mouth every 6 (six) hours as needed (for pain.).     multivitamin with minerals Tabs tablet  Take 1 tablet by mouth daily.     triamcinolone cream 0.1 %  Commonly known as:  KENALOG  Apply 1 application topically 2 (two) times daily.        Discharge Instructions:     Follow-up Information   Follow up with [facility] On 05/21/2014. (1:30pm, arrive at 1:00pm for paperwork)    Contact information:   [address]   [place] [address] [phone]       Follow up with obtain primary care doctor to follow up with for Mono.      Signed: Letha CapeSBORNE,KELLY E 05/02/2014, 8:39 AM

## 2014-05-02 NOTE — Discharge Instructions (Signed)
[facility] ______[facility] °LAPAROSCOPIC SURGERY: POST OP INSTRUCTIONS °Always review your discharge instruction sheet given to you by the facility where your surgery was performed. °IF YOU HAVE DISABILITY OR FAMILY LEAVE FORMS, YOU MUST BRING THEM TO THE OFFICE FOR PROCESSING.   °DO NOT GIVE THEM TO YOUR DOCTOR. ° °1. A prescription for pain medication may be given to you upon discharge.  Take your pain medication as prescribed, if needed.  If narcotic pain medicine is not needed, then you may take acetaminophen (Tylenol) or ibuprofen (Advil) as needed. °2. Take your usually prescribed medications unless otherwise directed. °3. If you need a refill on your pain medication, please contact your pharmacy.  They will contact our office to request authorization. Prescriptions will not be filled after 5pm or on week-ends. °4. You should follow a light diet the first few days after arrival home, such as soup and crackers, etc.  Be sure to include lots of fluids daily. °5. Most patients will experience some swelling and bruising in the area of the incisions.  Ice packs will help.  Swelling and bruising can take several days to resolve.  °6. It is common to experience some constipation if taking pain medication after surgery.  Increasing fluid intake and taking a stool softener (such as Colace) will usually help or prevent this problem from occurring.  A mild laxative (Milk of Magnesia or Miralax) should be taken according to package instructions if there are no bowel movements after 48 hours. °7. Unless discharge instructions indicate otherwise, you may remove your bandages 24-48 hours after surgery, and you may shower at that time.  You may have steri-strips (small skin tapes) in place directly over the incision.  These strips should be left on the skin for 7-10 days.  If your surgeon used skin glue on the incision, you may shower in 24 hours.  The glue will flake off over the next 2-3 weeks.  Any sutures or  staples will be removed at the office during your follow-up visit. °8. ACTIVITIES:  You may resume regular (light) daily activities beginning the next day--such as daily self-care, walking, climbing stairs--gradually increasing activities as tolerated.  You may have sexual intercourse when it is comfortable.  Refrain from any heavy lifting or straining until approved by your doctor. °a. You may drive when you are no longer taking prescription pain medication, you can comfortably wear a seatbelt, and you can safely maneuver your car and apply brakes. °b. RETURN TO WORK:  __________________________________________________________ °9. You should see your doctor in the office for a follow-up appointment approximately 2-3 weeks after your surgery.  Make sure that you call for this appointment within a day or two after you arrive home to insure a convenient appointment time. °10. OTHER INSTRUCTIONS: __________________________________________________________________________________________________________________________ __________________________________________________________________________________________________________________________ °WHEN TO CALL YOUR DOCTOR: °1. Fever over 101.0 °2. Inability to urinate °3. Continued bleeding from incision. °4. Increased pain, redness, or drainage from the incision. °5. Increasing abdominal pain ° °The clinic staff is available to answer your questions during regular business hours.  Please don’t hesitate to call and ask to speak to one of the nurses for clinical concerns.  If you have a medical emergency, go to the nearest emergency room or call 911.  A surgeon from [facility] is always on call at the hospital. °[address], [address], [place], [place]  [address] ? [address] [address], [place],    [address] °[phone] ? [phone] ? FAX [phone] °Web site:   [email or web address]  Infectious Mononucleosis Infectious mononucleosis (mono)  is a common germ (viral) infection in children, teenagers, and young adults.  CAUSES  Mono is an infection caused by the Malachi CarlEpstein Barr virus. The virus is spread by close personal contact with someone who has the infection. It can be passed by contact with your saliva through things such as kissing or sharing drinking glasses. Sometimes, the infection can be spread from someone who does not appear sick but still spreads the virus (asymptomatic carrier state).  SYMPTOMS  The most common symptoms of Mono are:  Sore throat.  Headache.  Fatigue.  Muscle aches.  Swollen glands.  Fever.  Poor appetite.  Enlarged liver or spleen. The less common symptoms can include:  Rash.  Feeling sick to your stomach (nauseous).  Abdominal pain. DIAGNOSIS  Mono is diagnosed by a blood test.  TREATMENT  Treatment of mono is usually at home. There is no medicine that cures this virus. Sometimes hospital treatment is needed in severe cases. Steroid medicine sometimes is needed if the swelling in the throat causes breathing or swallowing problems.  HOME CARE INSTRUCTIONS   Drink enough fluids to keep your urine clear or pale yellow.  Eat soft foods. Cool foods like popsicles or ice cream can soothe a sore throat.  Only take over-the-counter or prescription medicines for pain, discomfort, or fever as directed by your caregiver. Children under 21 years of age should not take aspirin.  Gargle salt water. This may help relieve your sore throat. Put 1 teaspoon (tsp) of salt in 1 cup of warm water. Sucking on hard candy may also help.  Rest as needed.  Start regular activities gradually after the fever is gone. Be sure to rest when tired.  Avoid strenuous exercise or contact sports until your caregiver says it is okay. The liver and spleen could be seriously injured.  Avoid sharing drinking glasses or kissing until your caregiver tells you that you are no longer contagious. SEEK MEDICAL CARE IF:    Your fever is not gone after 7 days.  Your activity level is not back to normal after 2 weeks.  You have yellow coloring to eyes and skin (jaundice). SEEK IMMEDIATE MEDICAL CARE IF:   You have severe pain in the abdomen or shoulder.  You have trouble swallowing or drooling.  You have trouble breathing.  You develop a stiff neck.  You develop a severe headache.  You cannot stop throwing up (vomiting).  You have convulsions.  You are confused.  You have trouble with balance.  You develop signs of body fluid loss (dehydration):  Weakness.  Sunken eyes.  Pale skin.  Dry mouth.  Rapid breathing or pulse. MAKE SURE YOU:   Understand these instructions.  Will watch your condition.  Will get help right away if you are not doing well or get worse. Document Released: 10/29/2000 Document Revised: 01/24/2012 Document Reviewed: 08/27/2008 [facility] Patient Information 2015 [employer] [place]. This information is not intended to replace advice given to you by your health care provider. Make sure you discuss any questions you have with your health care provider.

## 2014-05-03 MED ORDER — OXYCODONE HCL 5 MG PO TABS: 5.0000 mg | ORAL_TABLET | ORAL | Status: DC | PRN

## 2014-05-03 MED ORDER — ONDANSETRON HCL 4 MG PO TABS: 4.0000 mg | ORAL_TABLET | ORAL | Status: DC | PRN

## 2014-05-03 MED ORDER — PANTOPRAZOLE SODIUM 40 MG PO TBEC: 40.0000 mg | DELAYED_RELEASE_TABLET | Freq: Every day | ORAL | Status: DC

## 2014-05-03 NOTE — Progress Notes (Signed)
Patient ID: Cameron Phillips, male   DOB: 09/20/1991, 23 y.o.   MRN: [id number] 2 Days Post-Op  Subjective: Pt feeling well this morning.  No nausea.  Pain well controlled.  Objective: Vital signs in last 24 hours: Temp:  [98.1 F (36.7 C)-100.9 F (38.3 C)] 98.3 F (36.8 C) (06/19 0614) Pulse Rate:  [58-126] 67 (06/19 0614) Resp:  [16-18] 16 (06/19 0614) BP: (103-115)/(46-93) 115/93 mmHg (06/19 0614) SpO2:  [95 %-100 %] 98 % (06/19 0614) Last BM Date: 05/02/14  Intake/Output from previous day: 06/18 0701 - 06/19 0700 In: 240 [P.O.:240] Out: -  Intake/Output this shift:    PE: Abd: soft, appropriately tender, +BS, incisions c/d/i  Lab Results:   Recent Labs  05/01/14 0024 05/02/14 0345  WBC 8.4 8.2  HGB 14.6 13.3  HCT 41.5 39.4  PLT 123* 129*   BMET  Recent Labs  05/01/14 0024 05/02/14 0345  NA 136* 139  K 3.7 4.2  CL 97 102  CO2 25 27  GLUCOSE 94 140*  BUN 10 7  CREATININE 1.04 1.01  CALCIUM 9.0 8.3*   PT/INR No results found for this basename: LABPROT, INR,  in the last 72 hours CMP     Component Value Date/Time   NA 139 05/02/2014 0345   K 4.2 05/02/2014 0345   CL 102 05/02/2014 0345   CO2 27 05/02/2014 0345   GLUCOSE 140* 05/02/2014 0345   BUN 7 05/02/2014 0345   CREATININE 1.01 05/02/2014 0345   CALCIUM 8.3* 05/02/2014 0345   PROT 5.8* 05/02/2014 0345   ALBUMIN 2.8* 05/02/2014 0345   AST 162* 05/02/2014 0345   ALT 170* 05/02/2014 0345   ALKPHOS 162* 05/02/2014 0345   BILITOT 3.1* 05/02/2014 0345   GFRNONAA >90 05/02/2014 0345   GFRAA >90 05/02/2014 0345   Lipase     Component Value Date/Time   LIPASE 39 05/01/2014 0450       Studies/Results: Dg Cholangiogram Operative  05/01/2014   CLINICAL DATA:  Gallstones, laparoscopic cholecystectomy  EXAM: INTRAOPERATIVE CHOLANGIOGRAM  FLUOROSCOPY TIME:  13 seconds  COMPARISON:  Abdominal ultrasound - 05/01/2014; CT the abdomen pelvis -05/01/2014  FINDINGS: Intraoperative angiographic images of the right  upper abdominal quadrant during laparoscopic cholecystectomy are provided for review.  Surgical clips overlie the expected location of the gallbladder fossa.  Contrast injection demonstrates selective cannulation of the central aspect of the cystic duct.  There is passage of contrast through the central aspect of the cystic duct with filling of a non dilated common bile duct. There is passage of contrast though the CBD and into the descending portion of the duodenum.  There is minimal reflux of injected contrast into the common hepatic duct and central aspect of the non dilated intrahepatic biliary system.  There are no discrete filling defects within the opacified portions of the biliary system to suggest the presence of choledocholithiasis.  IMPRESSION: No evidence of choledocholithiasis.   Electronically Signed   By: Simonne ComeJohn  Watts M.D.   On: 05/01/2014 10:27    Anti-infectives: Anti-infectives   Start     Dose/Rate Route Frequency Ordered Stop   05/01/14 1200  Ampicillin-Sulbactam (UNASYN) 3 g in sodium chloride 0.9 % 100 mL IVPB     3 g 100 mL/hr over 60 Minutes Intravenous Every 6 hours 05/01/14 1108     05/01/14 0515  Ampicillin-Sulbactam (UNASYN) 3 g in sodium chloride 0.9 % 100 mL IVPB     3 g 100 mL/hr over 60 Minutes Intravenous  Once 05/01/14 0508 05/01/14 0631       Assessment/Plan  1. POD 1, s/p lap chole 2. Mono  Plan: 1. Ok for [facility] home  ADDENDUM: Called this afternoon for fever of 100.8 and 2 episodes of emesis.  Dc held.  Suspect this is secondary to mono but will monitor another night.   LOS: 3 days    Sula Fetterly E 05/03/2014, 8:11 AM Pager: [phone]

## 2014-05-03 NOTE — Progress Notes (Signed)
Nursing Discharge Summary  Patient ID: Everlean PattersonJames A Ghan MRN: [id number] DOB/AGE: 21/04/1991 20 y.o.  Admit date: 04/30/2014 Discharge date: 05/03/2014  Discharged Condition: good  Disposition: 01-Home or Self Care  Follow-up Information   Follow up with [facility] On 05/21/2014. (1:30pm, arrive at 1:00pm for paperwork)    Contact information:   [address]   [place] [address] [phone]       Follow up with obtain primary care doctor to follow up with for Mono.      Prescriptions Given: Prescriptions given for Zofran and Oxy IR. Patient follow up appointments and medications discussed. Signs and symptoms that need to call the MD was discussed. Patient verbalized understanding and had no further questions.  Means of Discharge: Patient will be discharged home via private vehicle when mother arrives.   Signed: Gloriajean DellBaldwin, Regina Danielle 05/03/2014, 1:17 PM

## 2014-05-04 LAB — CMV (CYTOMEGALOVIRUS) DNA ULTRAQUANT, PCR: CMV DNA Quant: <200 copies/mL

## 2014-05-07 LAB — CULTURE, BLOOD (ROUTINE X 2)
CULTURE: NO GROWTH
Culture: NO GROWTH

## 2014-05-21 ENCOUNTER — Ambulatory Visit (INDEPENDENT_AMBULATORY_CARE_PROVIDER_SITE_OTHER): Payer: Self-pay | Admitting: General Surgery

## 2014-05-21 ENCOUNTER — Encounter (INDEPENDENT_AMBULATORY_CARE_PROVIDER_SITE_OTHER): Payer: Self-pay

## 2014-05-21 VITALS — BP 120/72 | HR 72 | Temp 98.6°F | Resp 14 | Ht 67.5 in | Wt 131.8 lb

## 2014-05-21 DIAGNOSIS — Z9049 Acquired absence of other specified parts of digestive tract: Secondary | ICD-10-CM

## 2014-05-21 DIAGNOSIS — Z9889 Other specified postprocedural states: Secondary | ICD-10-CM

## 2014-05-21 NOTE — Progress Notes (Signed)
Cameron PattersonJAMES A Phillips 07/19/1991 [id number] 05/21/2014   Cameron PattersonJames A Phillips is a 23 y.o. male who had a laparoscopic cholecystectomy with intraoperative cholangiogram by Dr. Daphine DeutscherMartin.  The pathology report confirmed chronic cholecystitis.  The patient reports that they are feeling well with normal bowel movements and good appetite.  The pre-operative symptoms of abdominal pain, nausea, and vomiting have resolved.    Physical examination - Incisions appear well-healed with no sign of infection or bleeding.   Abdomen - soft, non-tender  Impression:  s/p laparoscopic cholecystectomy  Plan:  He may resume a regular diet and full activity.  He may follow-up on a PRN basis.  Nija Koopman, ANP-BC

## 2015-04-05 IMAGING — CT CT ABD-PELV W/ CM
1 of 2 series · 15 of 32 positions shown, 19 images · IV contrast (omnipaque)
Comparison: Scrotal ultrasound performed 06/25/2008

CLINICAL DATA: Fever, nausea and vomiting.  Dark urine.

EXAM:
CT ABDOMEN AND PELVIS WITH CONTRAST
TECHNIQUE: Multidetector CT imaging of the abdomen and pelvis was performed
using the standard protocol following bolus administration of
intravenous contrast.
CONTRAST:  100mL OMNIPAQUE IOHEXOL 300 MG/ML  SOLN

[Series 2: abd/pel with · axial · 0.63mm/px · z∈[+1235,+1580]mm · 15 of 75 slices shown, 19 images]
[im 3/75  soft-tissue]
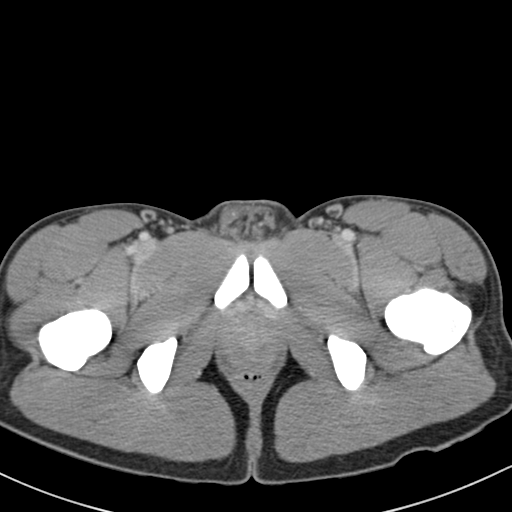
[im 3/75  bone]
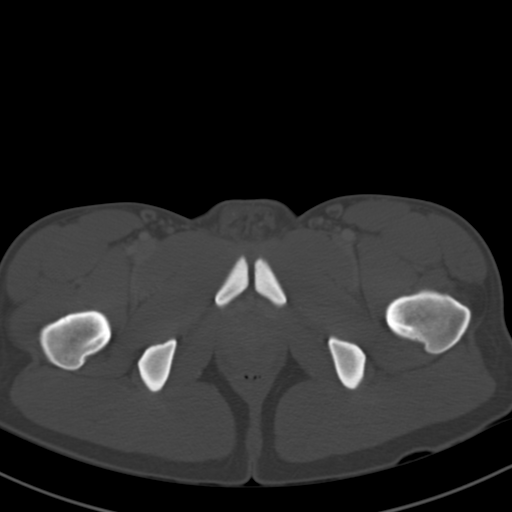
[im 9/75  soft-tissue]
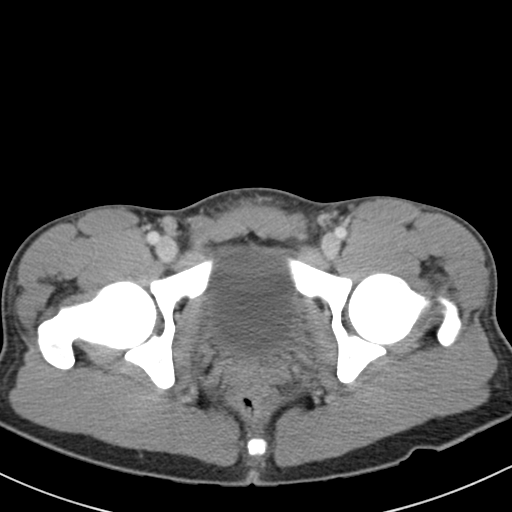
[im 15/75  soft-tissue]
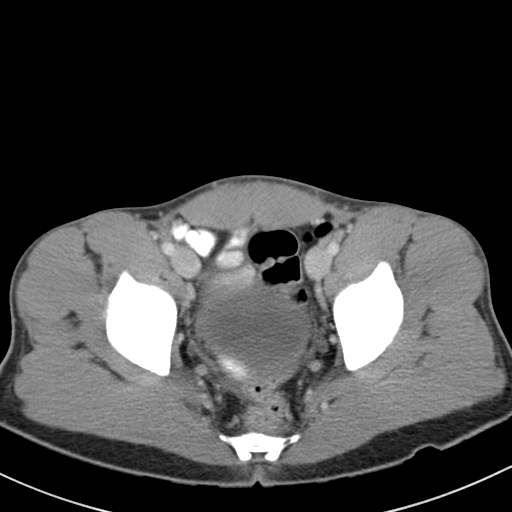
[im 20/75  soft-tissue]
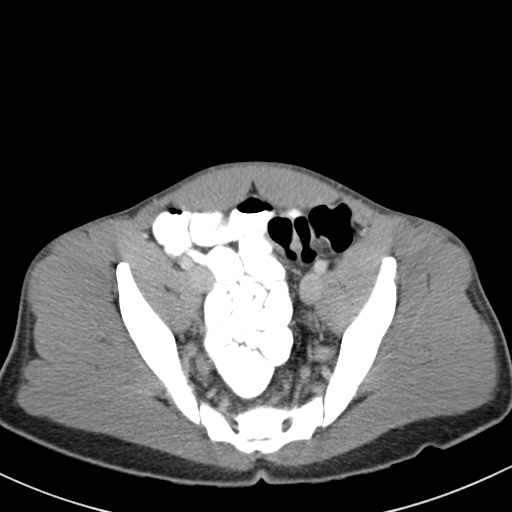
[im 26/75  soft-tissue]
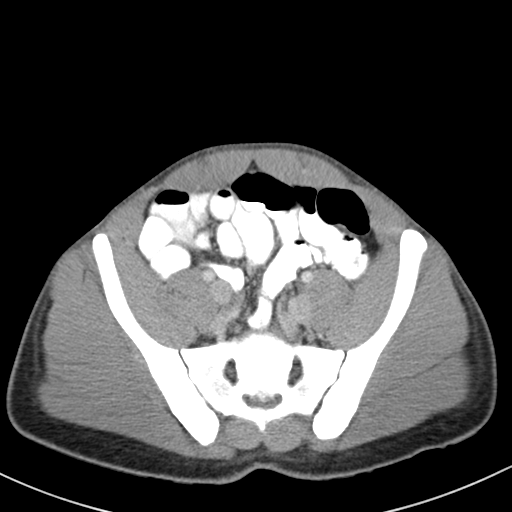
[im 32/75  soft-tissue]
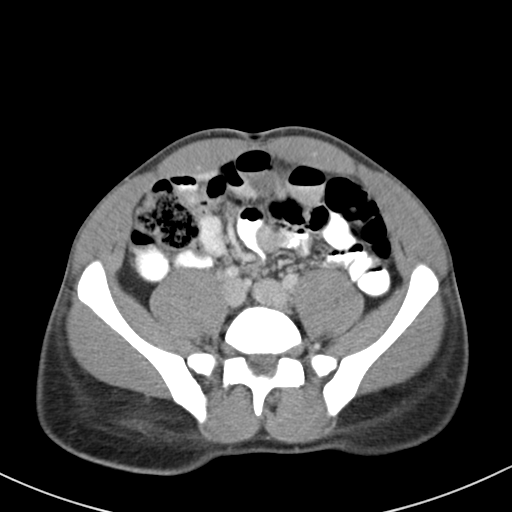
[im 38/75  soft-tissue]
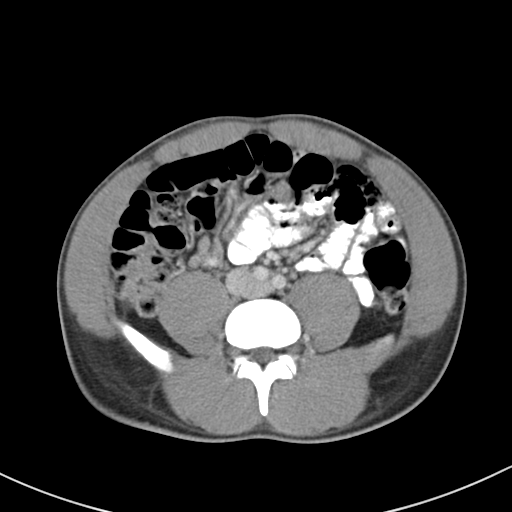
[im 43/75  soft-tissue]
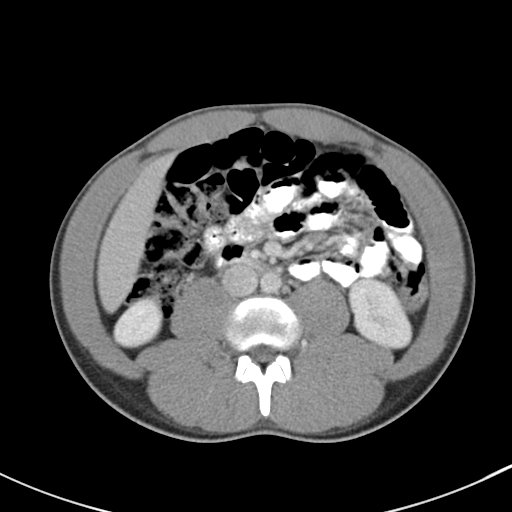
[im 49/75  soft-tissue]
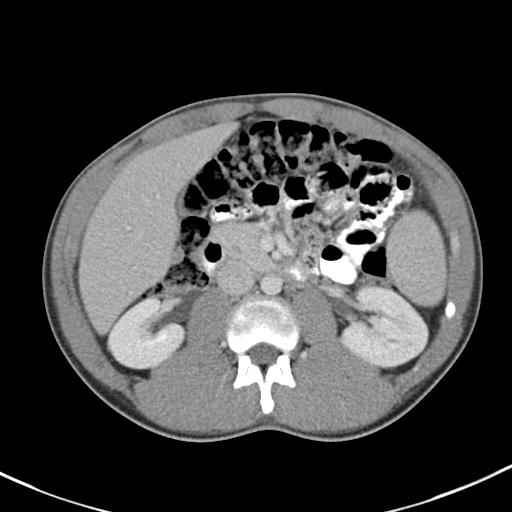
[im 49/75  bone]
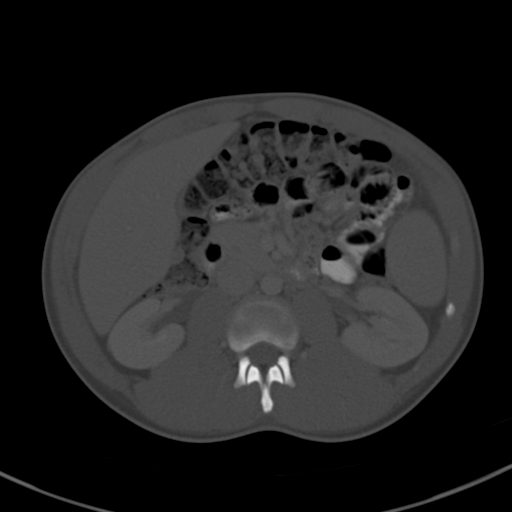
[im 55/75  soft-tissue]
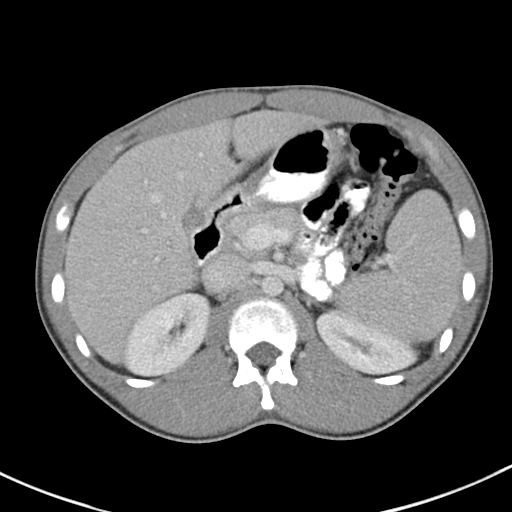
[im 60/75  soft-tissue]
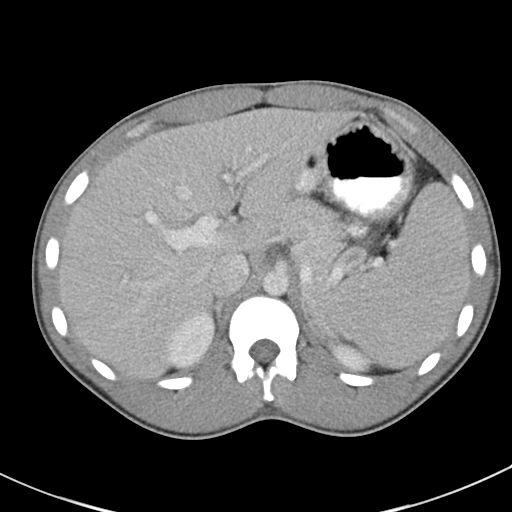
[im 63/75  lung]
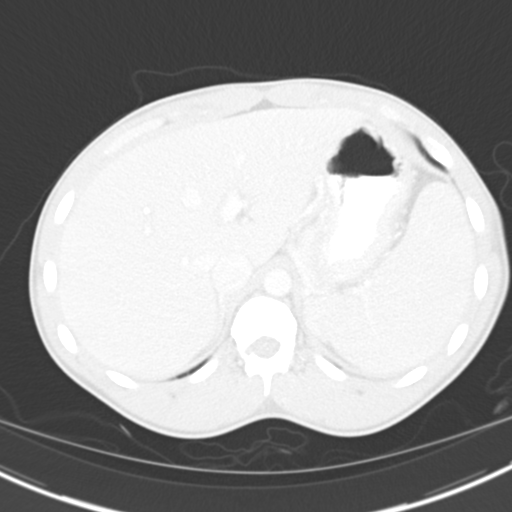
[im 66/75  soft-tissue]
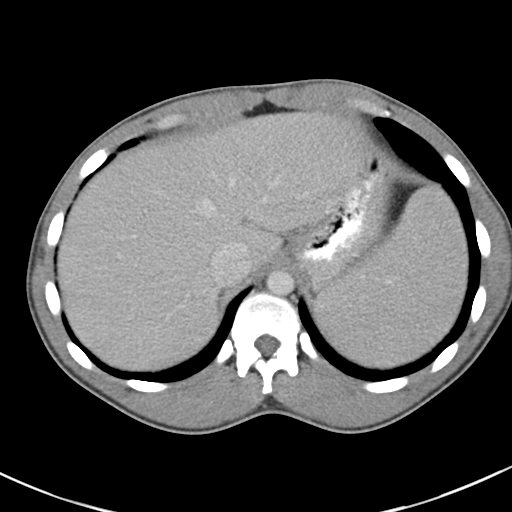
[im 66/75  lung]
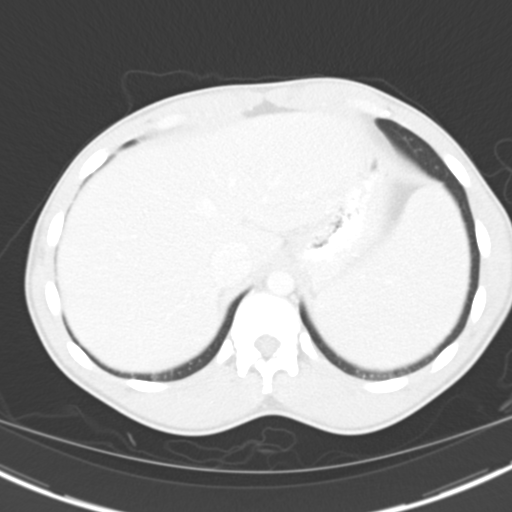
[im 69/75  lung]
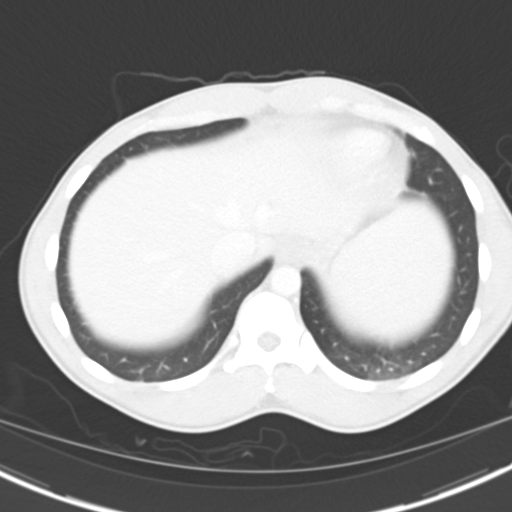
[im 72/75  soft-tissue]
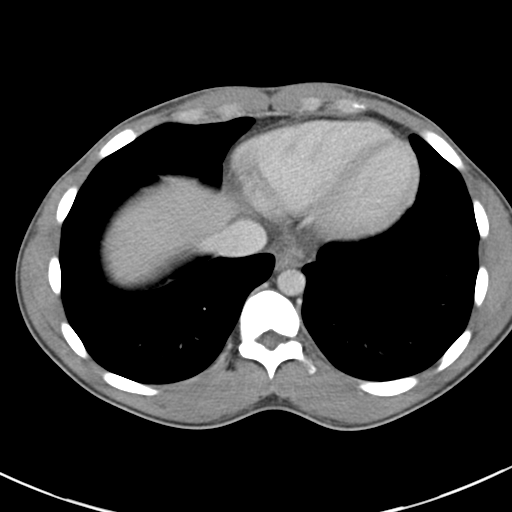
[im 72/75  lung]
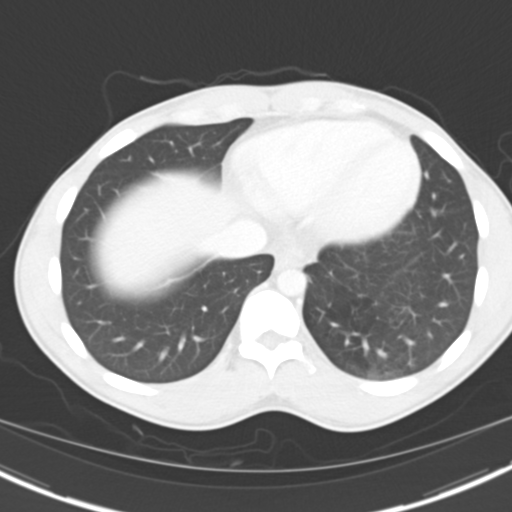

[15 of 32 positions shown; findings below may reference images not displayed]

FINDINGS: The visualized lung bases are clear.

The liver is unremarkable in appearance. The spleen is mildly
enlarged, measuring 13.4 cm in length. A small stone is noted within
the gallbladder; there is mild apparent gallbladder wall thickening,
of uncertain significance. The pancreas and adrenal glands are
unremarkable.

The kidneys are unremarkable in appearance. There is no evidence of
hydronephrosis. No renal or ureteral stones are seen. No perinephric
stranding is appreciated.

No free fluid is identified. The small bowel is unremarkable in
appearance. The stomach is within normal limits. No acute vascular
abnormalities are seen. Mildly prominent pericecal nodes are seen,
of uncertain significance.

The appendix is normal in caliber and contains air, without evidence
for appendicitis. The colon is grossly unremarkable in appearance.

The bladder is mildly distended and grossly unremarkable. The
prostate remains normal in size. No inguinal lymphadenopathy is
seen.

No acute osseous abnormalities are identified.
IMPRESSION: 1. Cholelithiasis noted. Mild apparent gallbladder wall thickening,
of uncertain significance. This could simply reflect partial
decompression, though would correlate to exclude mild cholecystitis.
2. Mild splenomegaly noted.
3. Mildly prominent pericecal nodes, of uncertain significance.

## 2015-04-05 IMAGING — US US ABDOMEN COMPLETE
1 series · 13 of 25 positions shown · non-contrast
Comparison: CT of the abdomen and pelvis performed earlier today at
[DATE] a.m.

CLINICAL DATA: Abdominal pain. Gallbladder wall thickening noted on
CT.

EXAM:
ULTRASOUND ABDOMEN COMPLETE

[Series 1: us abdomen complete · 0.20mm/px · 13 of 101 slices shown]
[im 1/101]
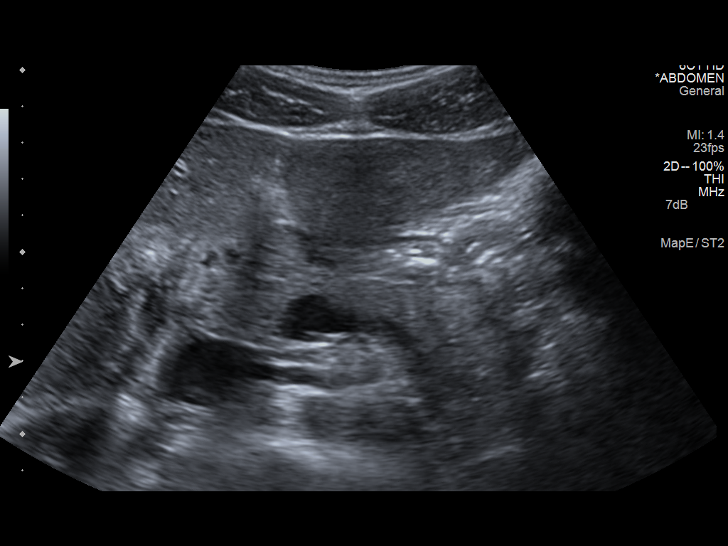
[im 9/101]
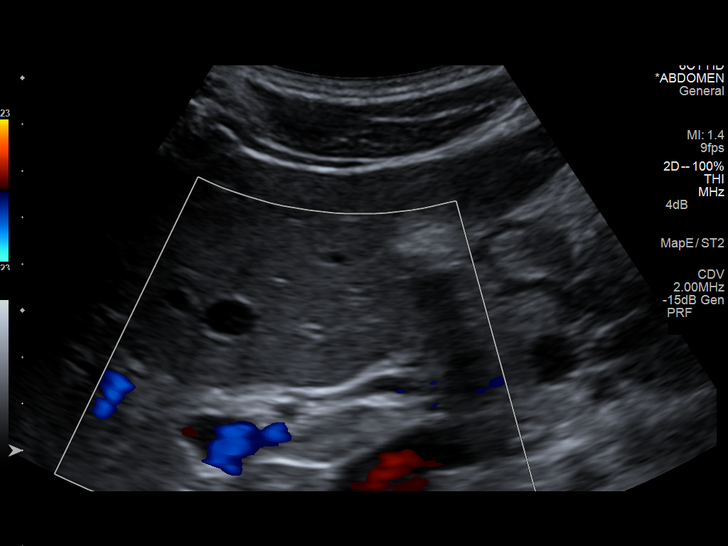
[im 17/101]
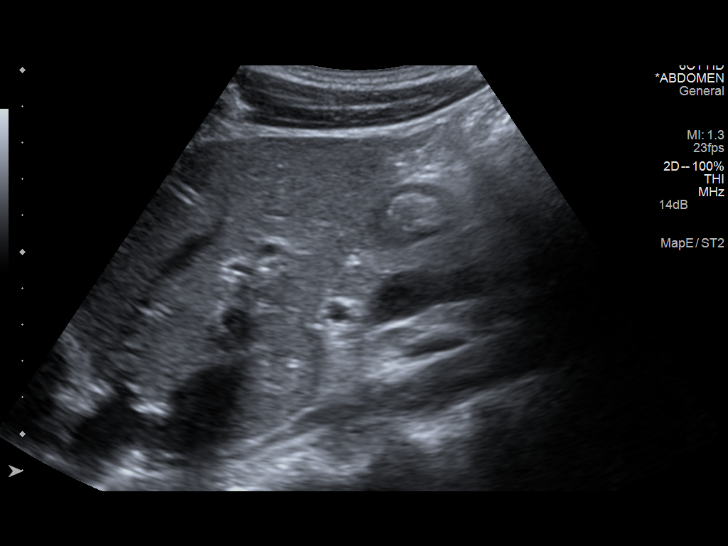
[im 26/101]
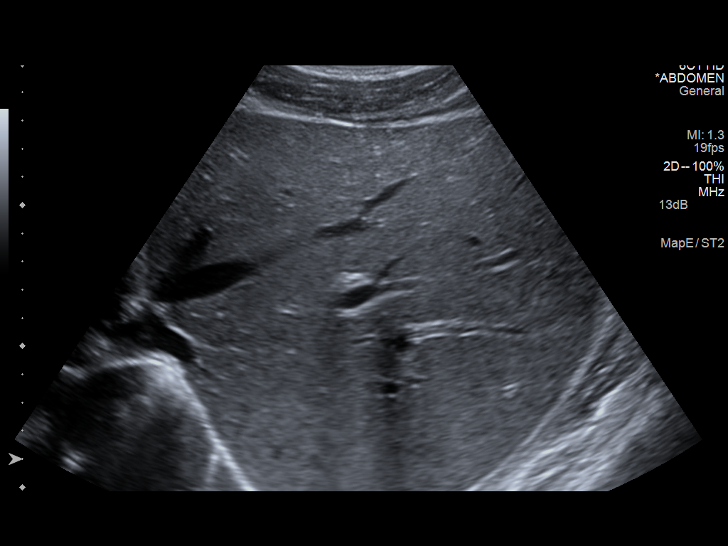
[im 34/101]
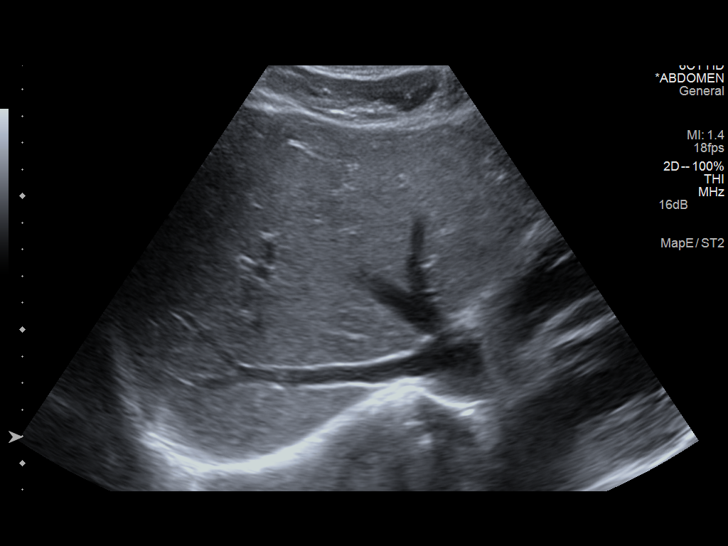
[im 42/101]
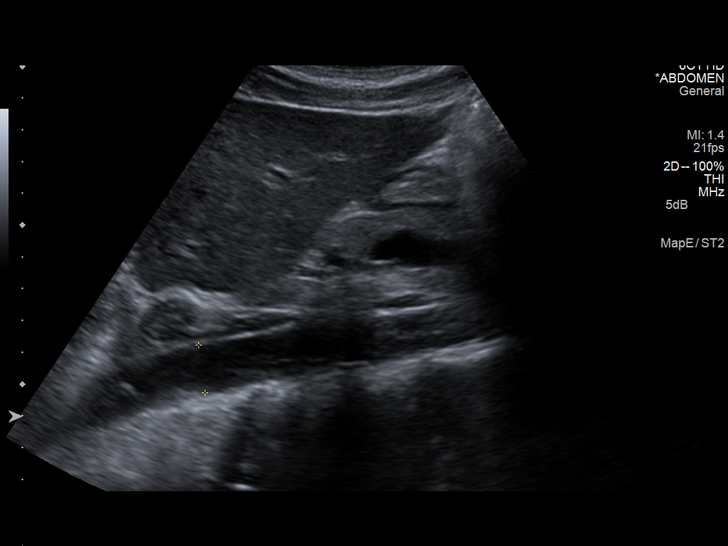
[im 51/101]
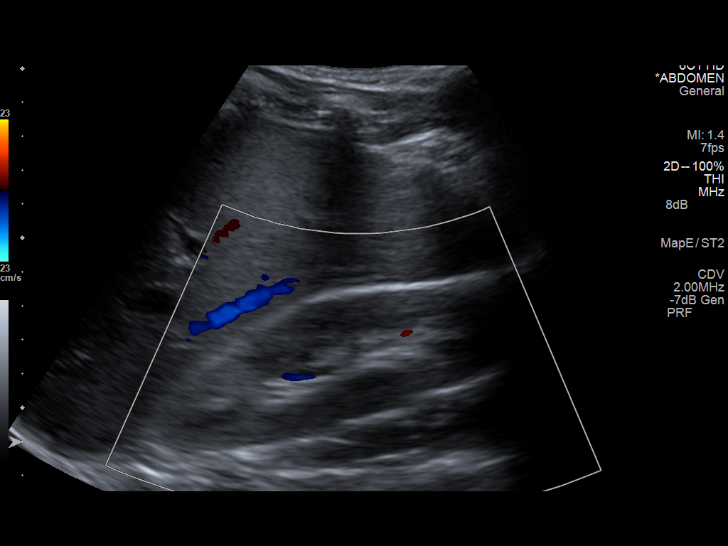
[im 59/101]
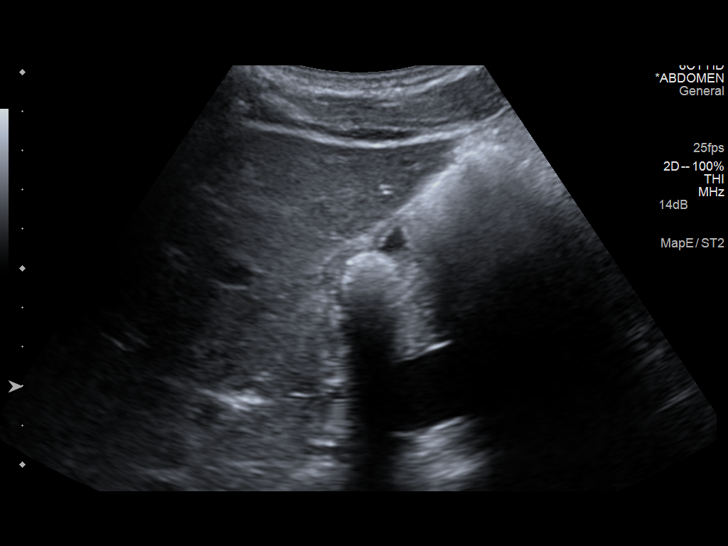
[im 67/101]
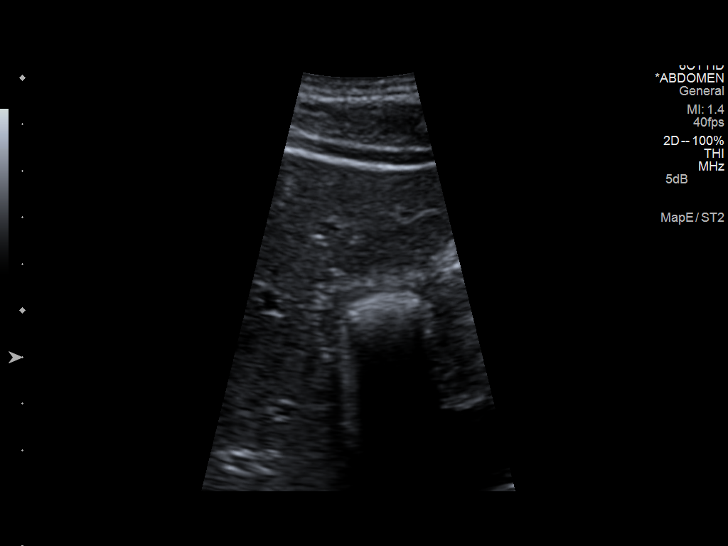
[im 76/101]
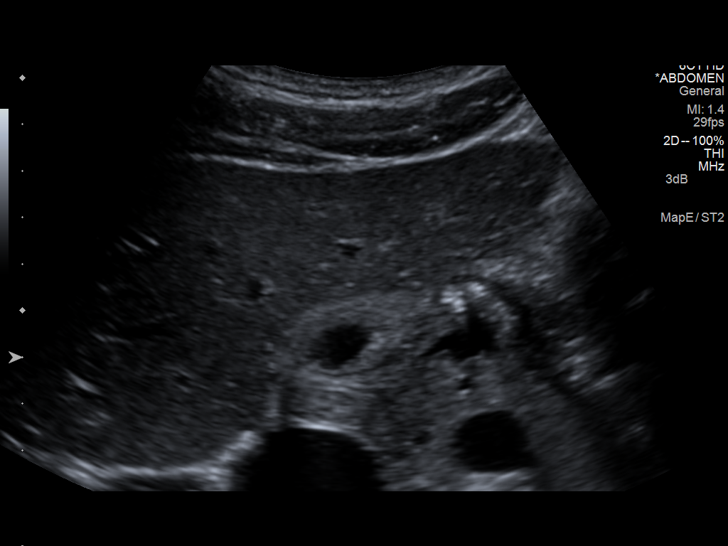
[im 84/101]
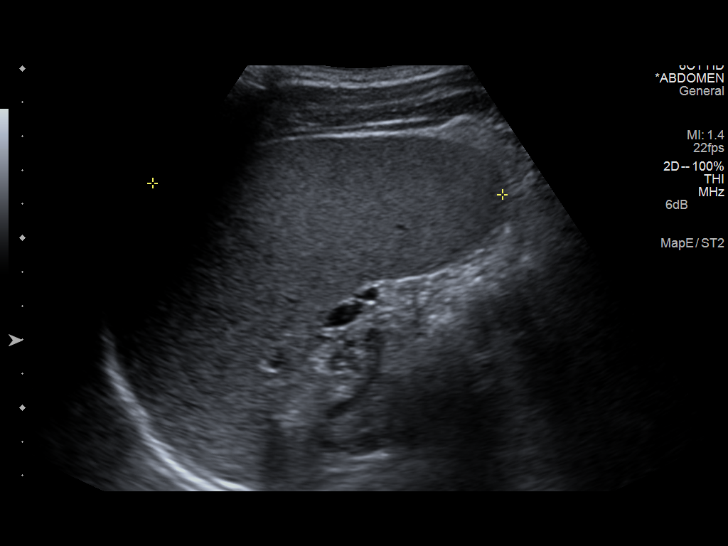
[im 92/101]
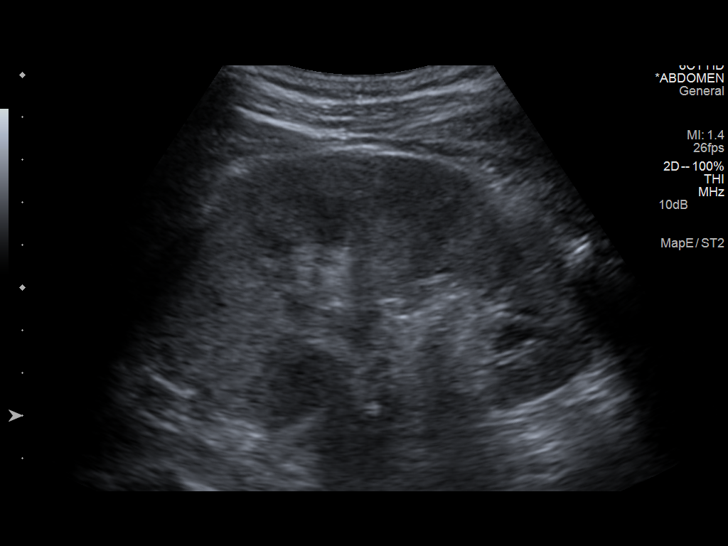
[im 101/101]
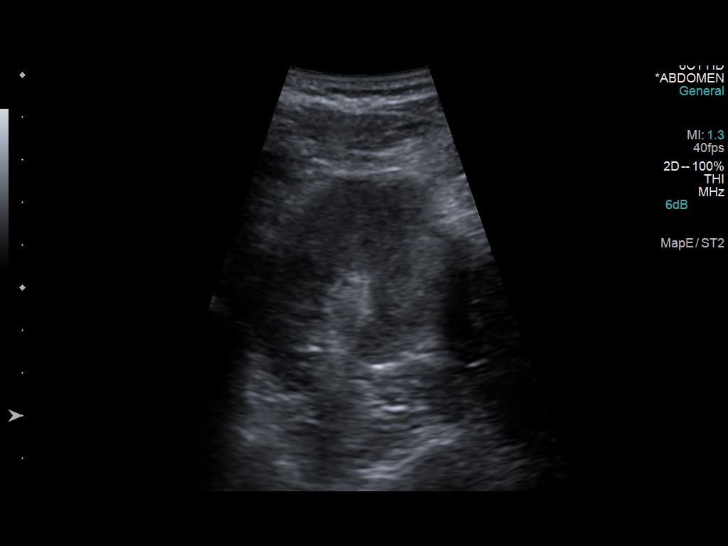

[13 of 25 positions shown; findings below may reference images not displayed]

FINDINGS: Gallbladder:

A large 1.7 cm stone is noted within the gallbladder. The
gallbladder is partially contracted. There is significant
gallbladder wall thickening, measuring up to 0.7 cm. No
pericholecystic fluid is seen. Evaluation for ultrasonographic
Murphy's sign is equivocal, as the patient is on pain medication.

Common bile duct:

Diameter: 0.3 cm, within normal limits in caliber.

Liver:

No focal lesion identified. Within normal limits in parenchymal
echogenicity.

IVC:

No abnormality visualized.

Pancreas:

Visualized portion unremarkable.

Spleen:

Borderline normal in length, though bulky, with increased volume.

Right Kidney:

Length: 10.8 cm. Echogenicity within normal limits. No mass or
hydronephrosis visualized.

Left Kidney:

Length: 10.6 cm. Echogenicity within normal limits. No mass or
hydronephrosis visualized.

Abdominal aorta:

No aneurysm visualized. The distal aspect of the abdominal aorta is
not characterized.

Other findings:

None.
IMPRESSION: 1. Diffuse gallbladder wall thickening again noted. Evaluation for
ultrasonographic Murphy's sign is equivocal, as the patient is on
pain medication. Findings raise question for mild acute
cholecystitis. Associated cholelithiasis noted. No evidence for
obstruction.
2. Splenomegaly noted.
